# Patient Record
Sex: Female | Born: 1995 | Race: Black or African American | Hispanic: No | Marital: Single | Smoking: Former smoker
Health system: Southern US, Community
[De-identification: ages and names within clinical notes are randomized; demographics above are authoritative.]

## PROBLEM LIST (undated history)

## (undated) DIAGNOSIS — B009 Herpesviral infection, unspecified: Secondary | ICD-10-CM

## (undated) DIAGNOSIS — J45909 Unspecified asthma, uncomplicated: Secondary | ICD-10-CM

## (undated) HISTORY — PX: NO PAST SURGERIES: SHX2092

---
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
## 2013-07-23 ENCOUNTER — Encounter: Payer: Self-pay | Admitting: Family Medicine

## 2013-07-23 ENCOUNTER — Other Ambulatory Visit (HOSPITAL_COMMUNITY)
Admission: RE | Admit: 2013-07-23 | Discharge: 2013-07-23 | Disposition: A | Discharge status: Home / Self Care | Payer: Medicaid Other | Source: Ambulatory Visit | Attending: Family Medicine | Admitting: Family Medicine

## 2013-07-23 ENCOUNTER — Ambulatory Visit (INDEPENDENT_AMBULATORY_CARE_PROVIDER_SITE_OTHER): Payer: Medicaid Other | Admitting: Family Medicine

## 2013-07-23 VITALS — BP 120/78 | HR 105 | Temp 98.1°F | Ht 62.0 in | Wt 116.0 lb

## 2013-07-23 DIAGNOSIS — N898 Other specified noninflammatory disorders of vagina: Secondary | ICD-10-CM

## 2013-07-23 DIAGNOSIS — Z113 Encounter for screening for infections with a predominantly sexual mode of transmission: Secondary | ICD-10-CM | POA: Insufficient documentation

## 2013-07-23 DIAGNOSIS — L2089 Other atopic dermatitis: Secondary | ICD-10-CM

## 2013-07-23 DIAGNOSIS — J45909 Unspecified asthma, uncomplicated: Secondary | ICD-10-CM

## 2013-07-23 DIAGNOSIS — J4520 Mild intermittent asthma, uncomplicated: Secondary | ICD-10-CM

## 2013-07-23 DIAGNOSIS — N760 Acute vaginitis: Secondary | ICD-10-CM

## 2013-07-23 LAB — POCT WET PREP (WET MOUNT): Clue Cells Wet Prep Whiff POC: POSITIVE

## 2013-07-23 MED ORDER — METRONIDAZOLE 500 MG PO TABS: 500.0000 mg | ORAL_TABLET | Freq: Two times a day (BID) | ORAL | Status: DC

## 2013-07-23 MED ORDER — TRIAMCINOLONE ACETONIDE 0.1 % EX OINT: 1.0000 "application " | TOPICAL_OINTMENT | Freq: Two times a day (BID) | CUTANEOUS | Status: DC

## 2013-07-23 NOTE — Progress Notes (Signed)
   Subjective:    Patient ID: Kelly Henry, female    DOB: 02/23/1995, 18 y.o.   MRN: [id number]  HPI  Vaginal discharge - for last 3 days.  Fishy odor, white discharge, no sores or fever or bleeding, No specific concerns about stds.  LMP -  Eczema Flare over last few weeks wityh dry skin on elbow and knee creases. Using body wash an otc HC.  No blisters or fever or mouth ulcers  Patient reports no  vision/ hearing changes,anorexia, weight change, fever ,adenopathy, persistant / recurrent hoarseness, swallowing issues, chest pain, edema,persistant / recurrent cough, hemoptysis, dyspnea(rest, exertional, paroxysmal nocturnal), gastrointestinal  bleeding (melena, rectal bleeding), abdominal pain, excessive heart burn, GU symptoms(dysuria, hematuria, pyuria, voiding/incontinence  Issues) syncope, focal weakness, severe memory loss, concerning skin lesions, depression, anxiety, abnormal bruising/bleeding, major joint swelling, breast masses or abnormal vaginal bleeding.   Except as above  Tobacco - smokes 3 cigs per day    Review of Systems     Objective:   Physical Exam  Alert no acute distress Neck:  No deformities, thyromegaly, masses, or tenderness noted.   Supple with full range of motion without pain. Heart - Regular rate and rhythm.  No murmurs, gallops or rubs. Lungs:  Normal respiratory effort, chest expands symmetrically. Lungs are clear to auscultation, no crackles or wheezes. Abdomen: soft and non-tender without masses, organomegaly or hernias noted.  No guarding or rebound Genitalia:  Normal introitus for age, no external lesions, no vaginal discharge, no odor, mucosa pink and moist, white thick vaginal or cervical lesions, no vaginal atrophy, no friaility or hemorrhage,  Extremities:  No cyanosis, edema, or deformity noted with good range of motion of all major joints.   Skin - dry area over flexure creases in elbows and knees - poorly demarginated       Assessment &  Plan:

## 2013-07-23 NOTE — Patient Instructions (Signed)
Good to see you today!  Thanks for coming in.  For your skin use the ointment twice daily and Dove or basis soap only as needed  For the vaginal discharge  Smoking - the best thing you could do for your health is to stop smoking  Consider finding a substitute  Saving the money you don't spend

## 2013-07-23 NOTE — Assessment & Plan Note (Signed)
Using albuterol once per week only

## 2013-07-23 NOTE — Assessment & Plan Note (Signed)
Flared.  Treat with steroid ointment

## 2013-07-23 NOTE — Assessment & Plan Note (Signed)
Wet mount consistent with BV.  Treat and screen for stds

## 2013-07-24 ENCOUNTER — Telehealth: Payer: Self-pay | Admitting: *Deleted

## 2013-07-24 ENCOUNTER — Encounter: Payer: Self-pay | Admitting: *Deleted

## 2013-07-24 NOTE — Telephone Encounter (Signed)
Message copied by Henri MedalHARTSELL, JAZMIN M on Tue Jul 24, 2013  4:26 PM ------      Message from: Carney LivingHAMBLISS, MARSHALL L      Created: Tue Jul 24, 2013  4:14 PM       Please notify her her cultures are normal      Thanks ------

## 2013-07-24 NOTE — Telephone Encounter (Signed)
LM for patient to call back regarding results.  Letter mailed. Jazmin Hartsell,CMA

## 2013-10-04 ENCOUNTER — Other Ambulatory Visit: Payer: Self-pay | Admitting: Family Medicine

## 2014-04-24 ENCOUNTER — Other Ambulatory Visit (HOSPITAL_COMMUNITY)
Admission: RE | Admit: 2014-04-24 | Discharge: 2014-04-24 | Disposition: A | Discharge status: Home / Self Care | Payer: Medicaid Other | Source: Ambulatory Visit | Attending: Family Medicine | Admitting: Family Medicine

## 2014-04-24 ENCOUNTER — Telehealth: Payer: Self-pay | Admitting: Family Medicine

## 2014-04-24 ENCOUNTER — Ambulatory Visit (INDEPENDENT_AMBULATORY_CARE_PROVIDER_SITE_OTHER): Payer: Self-pay | Admitting: Family Medicine

## 2014-04-24 ENCOUNTER — Encounter: Payer: Self-pay | Admitting: Family Medicine

## 2014-04-24 VITALS — BP 128/78 | HR 77 | Temp 98.3°F | Ht 62.0 in | Wt 115.2 lb

## 2014-04-24 DIAGNOSIS — B9689 Other specified bacterial agents as the cause of diseases classified elsewhere: Secondary | ICD-10-CM | POA: Insufficient documentation

## 2014-04-24 DIAGNOSIS — N766 Ulceration of vulva: Secondary | ICD-10-CM

## 2014-04-24 DIAGNOSIS — Z113 Encounter for screening for infections with a predominantly sexual mode of transmission: Secondary | ICD-10-CM | POA: Insufficient documentation

## 2014-04-24 DIAGNOSIS — N760 Acute vaginitis: Secondary | ICD-10-CM

## 2014-04-24 DIAGNOSIS — A499 Bacterial infection, unspecified: Secondary | ICD-10-CM

## 2014-04-24 DIAGNOSIS — A5901 Trichomonal vulvovaginitis: Secondary | ICD-10-CM

## 2014-04-24 LAB — POCT WET PREP (WET MOUNT): Clue Cells Wet Prep Whiff POC: POSITIVE

## 2014-04-24 MED ORDER — METRONIDAZOLE 500 MG PO TABS: 500.0000 mg | ORAL_TABLET | Freq: Two times a day (BID) | ORAL | Status: DC

## 2014-04-24 NOTE — Telephone Encounter (Signed)
+  BV and trichomonas on wet prep. Will need treatment for herself and her partner.

## 2014-04-24 NOTE — Progress Notes (Signed)
Subjective: Kelly Henry is a 19 y.o. female patient of Dr. Phebe CollaEniola's presenting for vaginal rash.  She reports 7 days of constant, sudden onset painful vaginal rash. Rash involves bumps and sores "like pimples" on the outside of the vagina. No lesions on the inside. No rash anywhere else including mouth, lips, palms, soles. She tried vagisil without improvement. No fevers, abd pain, vaginal discharge, odor, dysuria, back pain, or missed periods. She is sexually active with a female partner and uses condoms regularly.   ROS see HPI Smoking Status noted  Objective: BP 128/78 mmHg  Pulse 77  Temp(Src) 98.3 F (36.8 C) (Oral)  Ht 5\' 2"  (1.575 m)  Wt 115 lb 3.2 oz (52.254 kg)  BMI 21.06 kg/m2  LMP 04/06/2014 (Approximate) Gen: Well appearing 19 y.o. female in no distress Pelvic: Scattered (< 15 total) very tender papules and pustules (~0.5cm diameter) on labia majora bilaterally with some shallow ulcerations. No inguinal lymphadenopathy. Vaginal mucosa pink, moist, normal rugae.  Nonfriable cervix without lesions, no discharge or bleeding noted on speculum exam. No cervical motion tenderness.   April Zimmerman, CMA present throughout duration of exam.   Assessment/Plan: Kelly Henry is a 19 y.o. female here for genital outbreak.  See problem list for plan.

## 2014-04-24 NOTE — Assessment & Plan Note (Signed)
Treat with flagyl 500mg  BID x7 days as above for BV. Will discuss need for partner to be treated as well in lieu of EPT due to possibility of other coinfections.

## 2014-04-24 NOTE — Assessment & Plan Note (Signed)
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 No systemic symptoms, but no history of ulcers in past.  - Viral culture sent - Wet prep/GC/Chl.  - Follow up in 2-3 weeks for resolution recheck. May biopsy if Dx remains unclear.  Precepted with Dr. Leveda AnnaHensel.

## 2014-04-24 NOTE — Progress Notes (Signed)
I was the preceptor for this visit. 

## 2014-04-24 NOTE — Patient Instructions (Signed)
Follow up in 2 weeks to be reevaluated.

## 2014-04-24 NOTE — Telephone Encounter (Signed)
Called pt and informed her of below. Zimmerman Rumple, April D 

## 2014-04-25 LAB — CERVICOVAGINAL ANCILLARY ONLY
CHLAMYDIA, DNA PROBE: NEGATIVE
Neisseria Gonorrhea: NEGATIVE

## 2014-04-26 LAB — HERPES SIMPLEX VIRUS CULTURE: Organism ID, Bacteria: DETECTED

## 2014-05-13 ENCOUNTER — Encounter: Payer: Self-pay | Admitting: Family Medicine

## 2014-05-20 ENCOUNTER — Telehealth: Payer: Self-pay | Admitting: Family Medicine

## 2014-05-20 NOTE — Telephone Encounter (Signed)
Will forward to MD to discuss results with patient.  Jazmin Hartsell,CMA

## 2014-05-20 NOTE — Telephone Encounter (Signed)
Result discussed with patient. +HSV, she is currently asymptomatic. She is advised to schedule follow up with me soon for further management.

## 2014-05-20 NOTE — Telephone Encounter (Signed)
Pt has questions about the letter she received about her test results

## 2014-05-21 ENCOUNTER — Encounter: Payer: Self-pay | Admitting: Family Medicine

## 2014-05-21 ENCOUNTER — Ambulatory Visit (INDEPENDENT_AMBULATORY_CARE_PROVIDER_SITE_OTHER): Payer: Self-pay | Admitting: Family Medicine

## 2014-05-21 VITALS — BP 109/70 | HR 73 | Temp 98.5°F | Ht 62.0 in | Wt 114.2 lb

## 2014-05-21 DIAGNOSIS — L309 Dermatitis, unspecified: Secondary | ICD-10-CM

## 2014-05-21 DIAGNOSIS — Z202 Contact with and (suspected) exposure to infections with a predominantly sexual mode of transmission: Secondary | ICD-10-CM

## 2014-05-21 DIAGNOSIS — R894 Abnormal immunological findings in specimens from other organs, systems and tissues: Secondary | ICD-10-CM

## 2014-05-21 MED ORDER — TRIAMCINOLONE ACETONIDE 0.1 % EX OINT: 1.0000 "application " | TOPICAL_OINTMENT | Freq: Two times a day (BID) | CUTANEOUS | Status: DC

## 2014-05-21 NOTE — Progress Notes (Signed)
Subjective:     Patient ID: Kelly Henry, female   DOB: 09/17/1995, 19 y.o.   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 She denies any vaginal discharge or lesion today. She completed her treatment for BV and Trichomonas as prescribed. Her partner has not had any oral or vulva lesion.  She had been with the same partner for 1.2326yr no STD in both partner and patient. Eczema:Need refill of her topical steroid cream.  Current Outpatient Prescriptions on File Prior to Visit  Medication Sig Dispense Refill  . QVAR 40 MCG/ACT inhaler inhale 1 puff by mouth twice a day for asthma 1 Inhaler 2  . VENTOLIN HFA 108 (90 BASE) MCG/ACT inhaler INHALE 2 PUFFS INTO THE LUNGS EVERY 4 HOURS AS NEEDED, RESCUE INHALER: FOR SHORTNESS OF BREATH, USE IN PLACE OF ALBUTEROL NEBS 36 g 3   No current facility-administered medications on file prior to visit.   History reviewed. No pertinent past medical history.    Review of Systems  Respiratory: Negative.   Cardiovascular: Negative.   Gastrointestinal: Negative.   Genitourinary: Negative.   All other systems reviewed and are negative.      Filed Vitals:   05/21/14 1011  BP: 109/70  Pulse: 73  Temp: 98.5 F (36.9 C)  TempSrc: Oral  Height: 5\' 2"  (1.575 m)  Weight: 114 lb 3 oz (51.795 kg)    Objective:   Physical Exam  Constitutional: She appears well-developed. No distress.  Cardiovascular: Normal rate, regular rhythm and normal heart sounds.   No murmur heard. Pulmonary/Chest: Effort normal and breath sounds normal. No respiratory distress. She has no wheezes. She exhibits no tenderness.  Abdominal: Soft. Bowel sounds are normal. She exhibits no distension and no mass.  Genitourinary:  Deferred,no genital lesion per patient and no discharge.   Nursing note and vitals reviewed.      Assessment:     + HSV 1 via culture Eczema     Plan:     Check problem list.

## 2014-05-21 NOTE — Patient Instructions (Signed)
Genital Herpes °Genital herpes is a sexually transmitted disease. This means that it is a disease passed by having sex with an infected person. There is no cure for genital herpes. The time between attacks can be months to years. The virus may live in a person but produce no problems (symptoms). This infection can be passed to a baby as it travels down the birth canal (vagina). In a newborn, this can cause central nervous system damage, eye damage, or even death. The virus that causes genital herpes is usually HSV-2 virus. The virus that causes oral herpes is usually HSV-1. The diagnosis (learning what is wrong) is made through culture results. °SYMPTOMS  °Usually symptoms of pain and itching begin a few days to a week after contact. It first appears as small blisters that progress to small painful ulcers which then scab over and heal after several days. It affects the outer genitalia, birth canal, cervix, penis, anal area, buttocks, and thighs. °HOME CARE INSTRUCTIONS  °· Keep ulcerated areas dry and clean. °· Take medications as directed. Antiviral medications can speed up healing. They will not prevent recurrences or cure this infection. These medications can also be taken for suppression if there are frequent recurrences. °· While the infection is active, it is contagious. Avoid all sexual contact during active infections. °· Condoms may help prevent spread of the herpes virus. °· Practice safe sex. °· Wash your hands thoroughly after touching the genital area. °· Avoid touching your eyes after touching your genital area. °· Inform your caregiver if you have had genital herpes and become pregnant. It is your responsibility to insure a safe outcome for your baby in this pregnancy. °· Only take over-the-counter or prescription medicines for pain, discomfort, or fever as directed by your caregiver. °SEEK MEDICAL CARE IF:  °· You have a recurrence of this infection. °· You do not respond to medications and are not  improving. °· You have new sources of pain or discharge which have changed from the original infection. °· You have an oral temperature above 102° F (38.9° C). °· You develop abdominal pain. °· You develop eye pain or signs of eye infection. °Document Released: 01/23/2000 Document Revised: 04/19/2011 Document Reviewed: 02/12/2009 °Exit[employer]® Patient Information ©2015 [employer]. This information is not intended to replace advice given to you by your health care provider. Make sure you discuss any questions you have with your health care provider. ° °

## 2014-05-21 NOTE — Assessment & Plan Note (Signed)
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 Patient is asking for a retest, she does not believe she has this. I recommended getting a blood test since she currently does not have any lesion. I spent time discussing the difference between HSV 1 & 2  I also recommended HIV and RPR testing since she will be getting blood test anyway. She agreed with plan.

## 2014-05-21 NOTE — Assessment & Plan Note (Signed)
Currently asymptomatic. I refilled her triamcinolone.

## 2014-05-22 ENCOUNTER — Telehealth: Payer: Self-pay | Admitting: Family Medicine

## 2014-05-22 ENCOUNTER — Encounter: Payer: Self-pay | Admitting: Family Medicine

## 2014-05-22 LAB — RPR

## 2014-05-22 LAB — HSV 2 ANTIBODY, IGG: HSV 2 Glycoprotein G Ab, IgG: 0.23 IV

## 2014-05-22 LAB — HSV 1 ANTIBODY, IGG: HSV 1 Glycoprotein G Ab, IgG: 0.71 IV

## 2014-05-22 LAB — HIV ANTIBODY (ROUTINE TESTING W REFLEX): HIV 1&2 Ab, 4th Generation: NONREACTIVE

## 2014-05-22 NOTE — Telephone Encounter (Signed)
Result discussed with patient. She will hold off on retesting for now.

## 2014-05-22 NOTE — Telephone Encounter (Signed)
Message left for her to call back about her test result. No further information was given.    Result: serum Neg HSV 1 & 2              + HSV 1 wound culture.   It could be that this is a recent infection, it may take up to 12 wks or more for antibody to occur in the blood although her culture was positive. She may return in 3-5 months to recheck if she is still concern.  I will mail result letter to her since she didn't pick up her call.

## 2014-09-14 ENCOUNTER — Encounter (HOSPITAL_COMMUNITY): Payer: Self-pay | Admitting: Emergency Medicine

## 2014-09-14 ENCOUNTER — Emergency Department (HOSPITAL_COMMUNITY): Payer: Self-pay

## 2014-09-14 ENCOUNTER — Emergency Department (HOSPITAL_COMMUNITY)
Admission: EM | Admit: 2014-09-14 | Discharge: 2014-09-14 | Disposition: A | Discharge status: Home / Self Care | Payer: Self-pay | Attending: Emergency Medicine | Admitting: Emergency Medicine

## 2014-09-14 DIAGNOSIS — Z3202 Encounter for pregnancy test, result negative: Secondary | ICD-10-CM | POA: Insufficient documentation

## 2014-09-14 DIAGNOSIS — J45909 Unspecified asthma, uncomplicated: Secondary | ICD-10-CM | POA: Insufficient documentation

## 2014-09-14 DIAGNOSIS — Z7951 Long term (current) use of inhaled steroids: Secondary | ICD-10-CM | POA: Insufficient documentation

## 2014-09-14 DIAGNOSIS — K2970 Gastritis, unspecified, without bleeding: Secondary | ICD-10-CM | POA: Insufficient documentation

## 2014-09-14 HISTORY — DX: Unspecified asthma, uncomplicated: J45.909

## 2014-09-14 LAB — URINALYSIS, ROUTINE W REFLEX MICROSCOPIC
Bilirubin Urine: NEGATIVE
GLUCOSE, UA: NEGATIVE mg/dL
HGB URINE DIPSTICK: NEGATIVE
Ketones, ur: NEGATIVE mg/dL
Leukocytes, UA: NEGATIVE
Nitrite: NEGATIVE
PROTEIN: 30 mg/dL — AB
Specific Gravity, Urine: 1.022 (ref 1.005–1.030)
Urobilinogen, UA: 1 mg/dL (ref 0.0–1.0)
pH: 8.5 — ABNORMAL HIGH (ref 5.0–8.0)

## 2014-09-14 LAB — COMPREHENSIVE METABOLIC PANEL
ALK PHOS: 48 U/L (ref 38–126)
ALT: 25 U/L (ref 14–54)
AST: 33 U/L (ref 15–41)
Albumin: 4.8 g/dL (ref 3.5–5.0)
Anion gap: 10 (ref 5–15)
BUN: 9 mg/dL (ref 6–20)
CALCIUM: 9.5 mg/dL (ref 8.9–10.3)
CO2: 22 mmol/L (ref 22–32)
Chloride: 108 mmol/L (ref 101–111)
Creatinine, Ser: 0.62 mg/dL (ref 0.44–1.00)
GFR calc non Af Amer: >60 mL/min (ref >60)
GLUCOSE: 103 mg/dL — AB (ref 65–99)
POTASSIUM: 3.4 mmol/L — AB (ref 3.5–5.1)
Sodium: 140 mmol/L (ref 135–145)
Total Bilirubin: 0.6 mg/dL (ref 0.3–1.2)
Total Protein: 7.8 g/dL (ref 6.5–8.1)

## 2014-09-14 LAB — CBC WITH DIFFERENTIAL/PLATELET
Basophils Absolute: 0 10*3/uL (ref 0.0–0.1)
Basophils Relative: 0 % (ref 0–1)
EOS ABS: 0 10*3/uL (ref 0.0–0.7)
Eosinophils Relative: 0 % (ref 0–5)
HEMATOCRIT: 39.1 % (ref 36.0–46.0)
Hemoglobin: 13.1 g/dL (ref 12.0–15.0)
LYMPHS ABS: 0.6 10*3/uL — AB (ref 0.7–4.0)
LYMPHS PCT: 16 % (ref 12–46)
MCH: 29.2 pg (ref 26.0–34.0)
MCHC: 33.5 g/dL (ref 30.0–36.0)
MCV: 87.3 fL (ref 78.0–100.0)
MONO ABS: 0.1 10*3/uL (ref 0.1–1.0)
MONOS PCT: 3 % (ref 3–12)
Neutro Abs: 3.1 10*3/uL (ref 1.7–7.7)
Neutrophils Relative %: 81 % — ABNORMAL HIGH (ref 43–77)
PLATELETS: 237 10*3/uL (ref 150–400)
RBC: 4.48 MIL/uL (ref 3.87–5.11)
RDW: 12.9 % (ref 11.5–15.5)
WBC: 3.8 10*3/uL — ABNORMAL LOW (ref 4.0–10.5)

## 2014-09-14 LAB — POC URINE PREG, ED: Preg Test, Ur: NEGATIVE

## 2014-09-14 LAB — URINE MICROSCOPIC-ADD ON

## 2014-09-14 LAB — LIPASE, BLOOD: Lipase: 13 U/L — ABNORMAL LOW (ref 22–51)

## 2014-09-14 MED ORDER — ONDANSETRON HCL 4 MG/2ML IJ SOLN
4.0000 mg | Freq: Once | INTRAMUSCULAR | Status: AC
  Administered 2014-09-14: 4 mg via INTRAVENOUS
  Filled 2014-09-14: qty 2

## 2014-09-14 MED ORDER — SODIUM CHLORIDE 0.9 % IV BOLUS (SEPSIS)
1000.0000 mL | Freq: Once | INTRAVENOUS | Status: AC
  Administered 2014-09-14: 1000 mL via INTRAVENOUS

## 2014-09-14 MED ORDER — FENTANYL CITRATE (PF) 100 MCG/2ML IJ SOLN
100.0000 ug | Freq: Once | INTRAMUSCULAR | Status: AC
  Administered 2014-09-14: 100 ug via INTRAVENOUS
  Filled 2014-09-14: qty 2

## 2014-09-14 MED ORDER — SUCRALFATE 1 G PO TABS: 1.0000 g | ORAL_TABLET | Freq: Four times a day (QID) | ORAL | Status: DC

## 2014-09-14 MED ORDER — PROMETHAZINE HCL 25 MG PO TABS: 25.0000 mg | ORAL_TABLET | Freq: Three times a day (TID) | ORAL | Status: DC | PRN

## 2014-09-14 MED ORDER — FAMOTIDINE 20 MG PO TABS: 20.0000 mg | ORAL_TABLET | Freq: Two times a day (BID) | ORAL | Status: DC

## 2014-09-14 NOTE — ED Provider Notes (Signed)
CSN: [id number]     Arrival date & time 09/14/14  1410 History   First MD Initiated Contact with Patient 09/14/14 1501     Chief Complaint  Patient presents with  . Abdominal Pain    since this AM  . Nausea    since this AM     (Consider location/radiation/quality/duration/timing/severity/associated sxs/prior Treatment) HPI   Patient is a 19 year old female who presents with abdominal pain.  Patient states she awoke this morning with acute onset epigastric pain.  Pain is associated with nausea and vomiting. Patient states she was drinking alcohol last night. She states she has vomited several times today. She tried to take Pepto-bismol and ibuprofen but was unable to keep it down.  She denies headache, heartburn, indigestion, dysuria, constipation, headache, or dizziness.   Past Medical History  Diagnosis Date  . Asthma    History reviewed. No pertinent past surgical history. No family history on file. History  Substance Use Topics  . Smoking status: Passive Smoke Exposure - Never Smoker  . Smokeless tobacco: Never Used     Comment: Mother smokes  . Alcohol Use: No   OB History    No data available     Review of Systems All other systems negative except as documented in the HPI. All pertinent positives and negatives as reviewed in the HPI.   Allergies  Review of patient's allergies indicates no known allergies.  Home Medications   Prior to Admission medications   Medication Sig Start Date End Date Taking? Authorizing Provider  acetaminophen (TYLENOL) 325 MG tablet Take 650 mg by mouth every 6 (six) hours as needed for moderate pain.   Yes Historical Provider, MD  beclomethasone (QVAR) 40 MCG/ACT inhaler Inhale 1 puff into the lungs 2 (two) times daily.   Yes Historical Provider, MD  VENTOLIN HFA 108 (90 BASE) MCG/ACT inhaler INHALE 2 PUFFS INTO THE LUNGS EVERY 4 HOURS AS NEEDED, RESCUE INHALER: FOR SHORTNESS OF BREATH, USE IN PLACE OF ALBUTEROL NEBS 10/04/13  Yes Doreene Eland, MD  QVAR 40 MCG/ACT inhaler inhale 1 puff by mouth twice a day for asthma Patient not taking: Reported on 09/14/2014 01/15/12   Macy Mis, MD  triamcinolone ointment (KENALOG) 0.1 % Apply 1 application topically 2 (two) times daily. Patient not taking: Reported on 09/14/2014 05/21/14   Doreene Eland, MD   BP 124/83 mmHg  Pulse 64  Temp(Src) 98.3 F (36.8 C) (Oral)  Resp 20  SpO2 100%  LMP 09/01/2014 Physical Exam  Constitutional: She is oriented to person, place, and time. She appears well-developed and well-nourished. No distress.  HENT:  Head: Normocephalic and atraumatic.  Mouth/Throat: Oropharynx is clear and moist.  Eyes: Pupils are equal, round, and reactive to light.  Neck: Normal range of motion. Neck supple.  Cardiovascular: Normal rate, regular rhythm and normal heart sounds.  Exam reveals no gallop and no friction rub.   No murmur heard. Pulmonary/Chest: Effort normal and breath sounds normal. No respiratory distress. She has no wheezes.  Abdominal: Soft. Bowel sounds are normal. She exhibits no distension. There is no tenderness. There is no guarding.  Neurological: She is alert and oriented to person, place, and time.  Skin: Skin is warm and dry.  Psychiatric: She has a normal mood and affect. Her behavior is normal.  Nursing note and vitals reviewed.   ED Course  Procedures (including critical care time) Labs Review Labs Reviewed  CBC WITH DIFFERENTIAL/PLATELET - Abnormal; Notable for the  following:    WBC 3.8 (*)    Neutrophils Relative % 81 (*)    Lymphs Abs 0.6 (*)    All other components within normal limits  COMPREHENSIVE METABOLIC PANEL - Abnormal; Notable for the following:    Potassium 3.4 (*)    Glucose, Bld 103 (*)    All other components within normal limits  URINALYSIS, ROUTINE W REFLEX MICROSCOPIC (NOT AT [facility]) - Abnormal; Notable for the following:    pH 8.5 (*)    Protein, ur 30 (*)    All other components within normal limits   URINE MICROSCOPIC-ADD ON - Abnormal; Notable for the following:    Squamous Epithelial / LPF MANY (*)    All other components within normal limits  LIPASE, BLOOD - Abnormal; Notable for the following:    Lipase 13 (*)    All other components within normal limits  POC URINE PREG, ED   The patient refused ultrasound.  I have advised her that this was likely related to her alcohol consumption and she has gastritis.  Told to return here as needed.  Told to follow up with her primary care Dr. told to slowly increase her fluid intake and rest as much as possible     Charlestine Night, PA-C 09/14/14 1657  Samuel Jester, DO 09/18/14 1646

## 2014-09-14 NOTE — ED Notes (Signed)
Pt A+ox4, reports waking up this morning with "my abdomen being real tight".  Pt also reports +nausea.  Pt denies other complaints.  Skin PWD.  MAEI, self repositioning for comfort, ambulatory with steady gait.  Speaking full/clear sentences, rr even/un-lab.  Pt is well appearing.  NAD.

## 2014-09-14 NOTE — Discharge Instructions (Signed)
Return here as needed.  Slowly increase your fluid intake, rest as much as possible.  Follow-up with your primary care doctor °

## 2014-12-04 ENCOUNTER — Emergency Department (HOSPITAL_COMMUNITY)
Admission: EM | Admit: 2014-12-04 | Discharge: 2014-12-04 | Disposition: A | Discharge status: Home / Self Care | Payer: Medicaid Other | Attending: Emergency Medicine | Admitting: Emergency Medicine

## 2014-12-04 ENCOUNTER — Encounter (HOSPITAL_COMMUNITY): Payer: Self-pay | Admitting: Emergency Medicine

## 2014-12-04 DIAGNOSIS — Z79899 Other long term (current) drug therapy: Secondary | ICD-10-CM | POA: Insufficient documentation

## 2014-12-04 DIAGNOSIS — K029 Dental caries, unspecified: Secondary | ICD-10-CM | POA: Insufficient documentation

## 2014-12-04 DIAGNOSIS — Z7951 Long term (current) use of inhaled steroids: Secondary | ICD-10-CM | POA: Insufficient documentation

## 2014-12-04 DIAGNOSIS — K0889 Other specified disorders of teeth and supporting structures: Secondary | ICD-10-CM

## 2014-12-04 DIAGNOSIS — J45909 Unspecified asthma, uncomplicated: Secondary | ICD-10-CM | POA: Insufficient documentation

## 2014-12-04 DIAGNOSIS — K002 Abnormalities of size and form of teeth: Secondary | ICD-10-CM | POA: Insufficient documentation

## 2014-12-04 DIAGNOSIS — M542 Cervicalgia: Secondary | ICD-10-CM

## 2014-12-04 MED ORDER — MELOXICAM 15 MG PO TABS: 15.0000 mg | ORAL_TABLET | Freq: Every day | ORAL | Status: DC

## 2014-12-04 MED ORDER — NAPROXEN 500 MG PO TABS
500.0000 mg | ORAL_TABLET | Freq: Once | ORAL | Status: AC
Start: 2014-12-04 — End: 2014-12-04
  Administered 2014-12-04: 500 mg via ORAL
  Filled 2014-12-04: qty 1

## 2014-12-04 MED ORDER — CYCLOBENZAPRINE HCL 10 MG PO TABS
10.0000 mg | ORAL_TABLET | Freq: Once | ORAL | Status: AC
  Administered 2014-12-04: 10 mg via ORAL
  Filled 2014-12-04: qty 1

## 2014-12-04 MED ORDER — CYCLOBENZAPRINE HCL 10 MG PO TABS: 10.0000 mg | ORAL_TABLET | Freq: Two times a day (BID) | ORAL | Status: DC | PRN

## 2014-12-04 NOTE — ED Provider Notes (Signed)
CSN: [id number]     Arrival date & time 12/04/14  2057 History   By signing my name below, I, Kelly Henry, attest that this documentation has been prepared under the direction and in the presence of Emilia Beck, PA-C Electronically Signed: Gonzella Henry, Scribe. 12/04/2014. 10:49 PM.    Chief Complaint  Patient presents with  . Dental Problem  . Neck Pain      The history is provided by the patient. No language interpreter was used.    HPI Comments: CAMESHIA CRESSMAN is a 19 y.o. female who presents to the Emergency Department complaining of neck pain. Pt's symptoms started out as dental pain 2 months ago with radiation to right side of neck Pt reports onset of left sided neck pain two days ago. Pt reports pain with neck movement but denies pain to touch. She reports taking Ibuprofen and Tylenol extra strength with mild relief but reports no relief with massaging. Pt has been sleeping with a heating pad which has provided moderate relief. Pt has no other complaints at this time. Pt notes she has dental appointment tomorrow to have tooth extracted.  Past Medical History  Diagnosis Date  . Asthma    History reviewed. No pertinent past surgical history. History reviewed. No pertinent family history. Social History  Substance Use Topics  . Smoking status: Passive Smoke Exposure - Never Smoker  . Smokeless tobacco: Never Used     Comment: Mother smokes  . Alcohol Use: No   OB History    No data available     Review of Systems  Constitutional: Negative for chills and fatigue.  HENT: Positive for dental problem.   Musculoskeletal: Positive for neck pain.  All other systems reviewed and are negative.     Allergies  Review of patient's allergies indicates no known allergies.  Home Medications   Prior to Admission medications   Medication Sig Start Date End Date Taking? Authorizing Provider  acetaminophen (TYLENOL) 325 MG tablet Take 650 mg by mouth every  6 (six) hours as needed for moderate pain.    Historical Provider, MD  beclomethasone (QVAR) 40 MCG/ACT inhaler Inhale 1 puff into the lungs 2 (two) times daily.    Historical Provider, MD  cyclobenzaprine (FLEXERIL) 10 MG tablet Take 1 tablet (10 mg total) by mouth 2 (two) times daily as needed for muscle spasms. 12/04/14   Yoandri Congrove, PA-C  famotidine (PEPCID) 20 MG tablet Take 1 tablet (20 mg total) by mouth 2 (two) times daily. 09/14/14   Charlestine Night, PA-C  meloxicam (MOBIC) 15 MG tablet Take 1 tablet (15 mg total) by mouth daily. 12/04/14   Gennifer Potenza, PA-C  promethazine (PHENERGAN) 25 MG tablet Take 1 tablet (25 mg total) by mouth every 8 (eight) hours as needed for nausea or vomiting. 09/14/14   Charlestine Night, PA-C  QVAR 40 MCG/ACT inhaler inhale 1 puff by mouth twice a day for asthma Patient not taking: Reported on 09/14/2014 01/15/12   Macy Mis, MD  sucralfate (CARAFATE) 1 G tablet Take 1 tablet (1 g total) by mouth 4 (four) times daily. 09/14/14   Charlestine Night, PA-C  triamcinolone ointment (KENALOG) 0.1 % Apply 1 application topically 2 (two) times daily. Patient not taking: Reported on 09/14/2014 05/21/14   Doreene Eland, MD  VENTOLIN HFA 108 (90 BASE) MCG/ACT inhaler INHALE 2 PUFFS INTO THE LUNGS EVERY 4 HOURS AS NEEDED, RESCUE INHALER: FOR SHORTNESS OF BREATH, USE IN PLACE OF ALBUTEROL NEBS 10/04/13  Doreene ElandKehinde T Eniola, MD   BP 122/60 mmHg  Pulse 84  Temp(Src) 97.6 F (36.4 C) (Oral)  Resp 16  SpO2 100% Physical Exam  Constitutional: She is oriented to person, place, and time. She appears well-developed and well-nourished. No distress.  HENT:  Head: Normocephalic and atraumatic.  Poor dentition Large dental carry of the posterior right lower molar with tenderness to percussion Multiple other metal fillings in molars   Eyes: Conjunctivae are normal.  Neck: Normal range of motion. Neck supple.  Right lateral cervical tenderness to palpation    Cardiovascular: Normal rate and regular rhythm.  Exam reveals no gallop and no friction rub.   No murmur heard. Pulmonary/Chest: Effort normal and breath sounds normal. She has no wheezes. She has no rales. She exhibits no tenderness.  Abdominal: Soft. She exhibits no distension. There is no tenderness.  Musculoskeletal: Normal range of motion.  Neurological: She is alert and oriented to person, place, and time.  Speech is goal-oriented. Moves limbs without ataxia.   Skin: Skin is warm and dry.  Psychiatric: She has a normal mood and affect. Her behavior is normal.  Nursing note and vitals reviewed.   ED Course  Procedures  DIAGNOSTIC STUDIES:    Oxygen Saturation is 100% on room air, normal by my interpretation.   COORDINATION OF CARE:  10:32 PM Will discharge pt with pain medication and muscle relaxer. Suggests patient continue putting heat on neck and suggests pt follow up with dentist. Discussed treatment plan with pt at bedside and pt agreed to plan.     Labs Review Labs Reviewed - No data to display  Imaging Review    EKG Interpretation None      MDM   Final diagnoses:  Pain, dental  Bilateral neck pain    No signs of ludwigs angina. Patient will have flexeril and mobic for neck pain. Vitals stable and patient afebrile.   I personally performed the services described in this documentation, which was scribed in my presence. The recorded information has been reviewed and is accurate.      Emilia BeckKaitlyn Dontray Haberland, PA-C 12/05/14 [phone]  Lavera Guiseana Duo Liu, MD 12/05/14 1032

## 2014-12-04 NOTE — ED Notes (Signed)
Pt states has had a tooth ache from a cavity that has been on-going x 2 months. States pain began to spread into her jaw and right side of over the last month. Over the last couple days the pain has spread into the right side of her neck, and states she can't sleep d/t the pain. No obvious swelling, no redness/exudate in posterior throat, no difficulty breathing.

## 2014-12-04 NOTE — Discharge Instructions (Signed)
Take mobic as needed for pain. Take flexeril as needed for muscle spasm.

## 2015-05-31 ENCOUNTER — Emergency Department (HOSPITAL_COMMUNITY)
Admission: EM | Admit: 2015-05-31 | Discharge: 2015-05-31 | Disposition: A | Discharge status: Home / Self Care | Payer: Medicaid Other | Attending: Emergency Medicine | Admitting: Emergency Medicine

## 2015-05-31 ENCOUNTER — Encounter (HOSPITAL_COMMUNITY): Payer: Self-pay | Admitting: Emergency Medicine

## 2015-05-31 DIAGNOSIS — Z79899 Other long term (current) drug therapy: Secondary | ICD-10-CM | POA: Insufficient documentation

## 2015-05-31 DIAGNOSIS — J45909 Unspecified asthma, uncomplicated: Secondary | ICD-10-CM | POA: Insufficient documentation

## 2015-05-31 DIAGNOSIS — Z87891 Personal history of nicotine dependence: Secondary | ICD-10-CM | POA: Insufficient documentation

## 2015-05-31 DIAGNOSIS — K029 Dental caries, unspecified: Secondary | ICD-10-CM | POA: Insufficient documentation

## 2015-05-31 DIAGNOSIS — K0889 Other specified disorders of teeth and supporting structures: Secondary | ICD-10-CM | POA: Insufficient documentation

## 2015-05-31 DIAGNOSIS — M542 Cervicalgia: Secondary | ICD-10-CM | POA: Insufficient documentation

## 2015-05-31 MED ORDER — BUPIVACAINE-EPINEPHRINE (PF) 0.5% -1:200000 IJ SOLN
1.8000 mL | Freq: Once | INTRAMUSCULAR | Status: AC
  Administered 2015-05-31: 1.8 mL
  Filled 2015-05-31 (×2): qty 1.8

## 2015-05-31 MED ORDER — NAPROXEN 500 MG PO TABS: 500.0000 mg | ORAL_TABLET | Freq: Two times a day (BID) | ORAL | Status: DC

## 2015-05-31 MED ORDER — PENICILLIN V POTASSIUM 500 MG PO TABS: 500.0000 mg | ORAL_TABLET | Freq: Four times a day (QID) | ORAL | Status: AC

## 2015-05-31 NOTE — ED Provider Notes (Signed)
CSN: [id number]     Arrival date & time 05/31/15  0555 History   First MD Initiated Contact with Patient 05/31/15 0557     Chief Complaint  Patient presents with  . Dental Pain   Patient is a 20 y.o. female presenting with tooth pain.  Dental Pain Location:  Lower Lower teeth location:  32/RL 3rd molar Quality:  Throbbing and aching Severity:  Severe Onset quality:  Gradual Duration:  1 year Timing:  Intermittent Progression:  Worsening Chronicity:  Recurrent Context: dental caries   Previous work-up:  Dental exam Relieved by:  Nothing Worsened by:  Cold food/drink Ineffective treatments:  None tried Associated symptoms: neck pain   Associated symptoms: no difficulty swallowing, no drooling, no facial pain, no facial swelling, no fever, no neck swelling and no trismus   Risk factors: lack of dental care    Kelly Henry is a 20 year old female with PMHx of asthma presenting with dental pain. Onset of pain was 1 year ago with acute worsening in the last 24 hours. Denies specific exacerbating factor. States the dental pain has been intermittent over the past year but has been the most severe it has ever been this morning. Pain is described as aching and throbbing and worsened by cold air and food. She has not tried any medications PTA. She states that it radiates down the right side of her neck into her shoulder. Denies neck swelling or inability to rotate her head. She has been seen at a dental clinic outside of [facility] for this and told to follow up with an oral surgeon but she could not afford this. Denies fever, chills, dizziness, syncope, difficulty breathing, difficulty swallowing, nausea or vomiting.   Past Medical History  Diagnosis Date  . Asthma    History reviewed. No pertinent past surgical history. History reviewed. No pertinent family history. Social History  Substance Use Topics  . Smoking status: Former Smoker    Quit date: 03/01/2014  . Smokeless tobacco: Never Used      Comment: Mother smokes  . Alcohol Use: 1.8 oz/week    3 Standard drinks or equivalent per week   OB History    Gravida Para Term Preterm AB TAB SAB Ectopic Multiple Living       Review of Systems  Constitutional: Negative for fever.  HENT: Positive for dental problem. Negative for drooling and facial swelling.   Musculoskeletal: Positive for neck pain.  All other systems reviewed and are negative.     Allergies  Review of patient's allergies indicates no known allergies.  Home Medications   Prior to Admission medications   Medication Sig Start Date End Date Taking? Authorizing Provider  cyclobenzaprine (FLEXERIL) 10 MG tablet Take 1 tablet (10 mg total) by mouth 2 (two) times daily as needed for muscle spasms. Patient not taking: Reported on 05/31/2015 12/04/14   Emilia Beck, PA-C  famotidine (PEPCID) 20 MG tablet Take 1 tablet (20 mg total) by mouth 2 (two) times daily. Patient not taking: Reported on 05/31/2015 09/14/14   Charlestine Night, PA-C  meloxicam (MOBIC) 15 MG tablet Take 1 tablet (15 mg total) by mouth daily. Patient not taking: Reported on 05/31/2015 12/04/14   Emilia Beck, PA-C  naproxen (NAPROSYN) 500 MG tablet Take 1 tablet (500 mg total) by mouth 2 (two) times daily. 05/31/15   Taraoluwa Thakur, PA-C  penicillin v potassium (VEETID) 500 MG tablet Take 1 tablet (500 mg total)  by mouth 4 (four) times daily. 05/31/15 06/07/15  Rolm GalaStevi Keishia Ground, PA-C  promethazine (PHENERGAN) 25 MG tablet Take 1 tablet (25 mg total) by mouth every 8 (eight) hours as needed for nausea or vomiting. Patient not taking: Reported on 05/31/2015 09/14/14   Charlestine Nighthristopher Lawyer, PA-C  QVAR 40 MCG/ACT inhaler inhale 1 puff by mouth twice a day for asthma Patient not taking: Reported on 09/14/2014 01/15/12   Macy MisKim K Briscoe, MD  sucralfate (CARAFATE) 1 G tablet Take 1 tablet (1 g total) by mouth 4 (four) times daily. Patient not taking: Reported on 05/31/2015 09/14/14    Charlestine Nighthristopher Lawyer, PA-C  triamcinolone ointment (KENALOG) 0.1 % Apply 1 application topically 2 (two) times daily. Patient not taking: Reported on 09/14/2014 05/21/14   Doreene ElandKehinde T Eniola, MD  VENTOLIN HFA 108 (90 BASE) MCG/ACT inhaler INHALE 2 PUFFS INTO THE LUNGS EVERY 4 HOURS AS NEEDED, RESCUE INHALER: FOR SHORTNESS OF BREATH, USE IN PLACE OF ALBUTEROL NEBS Patient not taking: Reported on 05/31/2015 10/04/13   Doreene ElandKehinde T Eniola, MD   BP 120/72 mmHg  Pulse 95  Temp(Src) 98.4 F (36.9 C) (Oral)  Resp 18  Ht 5\' 2"  (1.575 m)  Wt 54.432 kg  BMI 21.94 kg/m2  SpO2 100%  LMP 05/07/2015 (Approximate) Physical Exam  Constitutional: She appears well-developed and well-nourished. No distress.  HENT:  Head: Normocephalic and atraumatic.  Right Ear: External ear normal.  Left Ear: External ear normal.  Mouth/Throat: Uvula is midline and oropharynx is clear and moist. Mucous membranes are not dry. No trismus in the jaw. No dental abscesses or uvula swelling.  Severe decay of the right lower 3rd molar. No erythema or drainage from the surrounding gumline. No obvious dental abscess. No trismus. No erythema or soft tissue swelling of the cheek.   Eyes: Conjunctivae are normal. Right eye exhibits no discharge. Left eye exhibits no discharge. No scleral icterus.  Neck: Normal range of motion. Neck supple.  FROM of neck intact without pain. No TTP. No cervical adenopathy. No soft tissue swelling of the neck.   Cardiovascular: Normal rate.   Pulmonary/Chest: Effort normal. No stridor. No respiratory distress.  Musculoskeletal: Normal range of motion.  Lymphadenopathy:    She has no cervical adenopathy.  Neurological: She is alert. Coordination normal.  Skin: Skin is warm and dry.  Psychiatric: She has a normal mood and affect. Her behavior is normal.  Nursing note and vitals reviewed.   ED Course  Procedures (including critical care time) Labs Review Labs Reviewed - No data to display  Imaging  Review No results found. I have personally reviewed and evaluated these images and lab results as part of my medical decision-making.   EKG Interpretation None      MDM   Final diagnoses:  Dentalgia   Patient presenting with tooth pain. No obvious abscess on exam. No soft tissue swelling of the cheek or neck. Exam unconcerning for Ludwig's angina or spread of infection.  Pain controlled in ED with dental block. Will discharge with penicillin and encouraged pt to follow-up with dentist. Resource guide given in discharge paperwork. Return precautions discussed with pt and given in discharge paperwork. Stable for discharge.      Rolm GalaStevi Elia Nunley, PA-C 05/31/15 [id number]  Tomasita CrumbleAdeleke Oni, MD 05/31/15 1455

## 2015-05-31 NOTE — Discharge Instructions (Signed)
Community Resource Guide Dental °The [employer]’s “211” is a great source of information about community services available.  Access by dialing 2-1-1 from anywhere in [place], or by website -  www.nc211.org.  ° °Other Local Resources (Updated 02/2015) ° °Dental  Care °  °Services ° °  °Phone Number and Address  °Cost  °[facility] For children 0 - 21 years of age:  °• Cleaning °• Tooth brushing/flossing instruction °• Sealants, fillings, crowns °• Extractions °• Emergency treatment  [phone] °[address] °[place], [place] [address] Charges based on family income.  Medicaid and some insurance plans accepted.   °  °[facility] • Cleaning °• Sealants, fillings, crowns °• Extractions °• Emergency treatment [phone] °[address] °[place], [place] ° Pregnant women 18 years of age or older with a Medicaid card  °[facility] [place] • Cleaning °• Sealants, fillings, crowns °• Extractions °• Emergency treatment [phone] °[address] °[place], [place] Pregnant women 18 years of age or older with a Medicaid card  °[facility] For children 0 - 21 years of age:  °• Cleaning °• Tooth brushing/flossing instruction °• Sealants, fillings, crowns °• Extractions °• Emergency treatment °Limited orthodontic services for patients with Medicaid [phone] °[address] °[place], [place] [address] Medicaid and [place] Health Choice cover for children up to age 21 and pregnant women.  Parents of children up to age 21 without Medicaid pay a reduced fee at time of service.  °[facility] [place] For children 0 - 21 years of age:  °• Cleaning °• Tooth brushing/flossing instruction °• Sealants, fillings, crowns °• Extractions °• Emergency treatment °Limited orthodontic services for patients with Medicaid  [phone] °[address] °[place], [place].  Medicaid and [place] Health Choice cover for children up to age 21 and pregnant women.  Parents of children up to age 21 without Medicaid pay a reduced fee.  °[facility] • Cleaning °• Sealants, fillings, crowns °• Extractions ° °Hours: Tuesdays and Thursdays, 4:15 - 8 pm [phone] °[address] °[place], [place] [address] Services free of charge to [place] residents ages 18-64 who do not have health insurance, Medicare, Medicaid, or VA benefits and fall within federal poverty guidelines  °[facility] ° ° ° Provides dental care in addition to primary medical care, nutritional counseling, and pharmacy: °• Cleaning °• Sealants, fillings, crowns °• Extractions ° ° ° ° ° ° ° ° ° ° ° ° ° ° ° ° ° [phone] °[facility], [address] °[place], [place] ° °[phone] °[facility], [address] [place], [place] ° °[phone] °[facility] °[place], [place] ° °[phone] °[facility], [address] °[place], [place] ° °[phone] °[facility] °[address] °[place], [place] Accepts Medicaid, Medicare, most insurance.  Also provides services available to all with fees adjusted based on ability to pay.    °[facility] • Cleaning °• Tooth brushing/flossing instruction °• Sealants, fillings, crowns °• Extractions °• Emergency treatment °Hours: Tuesdays, Thursdays, and Fridays from 8 am to 5 pm by appointment only. [phone] °[address] °[place], [place] [address] [place] residents with Medicaid (depending on eligibility) and children with [place] Health Choice - call for more information.  °  [facility] • Extractions only ° °Hours: 2nd and 4th Thursday of each month from 6:30 am - 9 am.   [phone] ext. [phone] °[address] °[place], [place] [address] Ages 18 and older only.  Patients are seen on a first come, first served basis.  °[facility] • Cleanings °• Fillings °• Extractions °• Orthodontics °• Endodontics °• Implants/Crowns/Bridges °• Complete and partial dentures [phone] °[place], [place] Patients must complete an application for services.  There is often a waiting list.   °Dental Pain °Dental pain may be caused by many things, including: °· Tooth decay (cavities or caries). Cavities expose the nerve of your tooth to air and hot or cold temperatures. This can cause pain or discomfort. °· Abscess or infection. A dental abscess is a collection of infected pus from a bacterial infection in the inner part of the tooth (pulp). It usually occurs at the end of the tooth's root. °· Injury. °· An unknown reason (idiopathic). °Your pain may be mild or severe. It may only occur when: °· You are chewing. °· You are exposed to hot or cold temperature. °· You are eating or drinking sugary foods or beverages, such as soda or candy. °Your pain may also be constant. °HOME CARE INSTRUCTIONS °Watch your dental pain for any changes. The following actions may help to lessen any discomfort that you are feeling: °· Take medicines only as directed by your dentist. °· If you were prescribed an antibiotic medicine, finish all of it even if you start to feel better. °· Keep all follow-up visits as directed by your dentist. This is important. °· Do not apply heat to the outside of your face. °· Rinse your mouth or gargle with salt water if directed by your dentist. This helps with pain and swelling. °¨ You can make salt water by adding ¼ tsp of salt to 1 cup of warm water. °· Apply ice to the painful area of your face: °¨ Put ice in a plastic bag. °¨ Place a towel between your skin and the bag. °¨ Leave the ice on for 20 minutes, 2-3 times per day. °· Avoid foods or drinks that cause you pain, such as: °¨ Very hot or very cold foods or  drinks. °¨ Sweet or sugary foods or drinks. °SEEK MEDICAL CARE IF: °· Your pain is not controlled with medicines. °· Your symptoms are worse. °· You have new symptoms. °SEEK IMMEDIATE MEDICAL CARE IF: °· You are unable to open your mouth. °· You are having trouble breathing or swallowing. °· You have a fever. °· Your face, neck, or jaw is swollen. °  °This information is not intended to replace advice given to you by your health care provider. Make sure you discuss any questions you have with your health care provider. °  °Document Released: 01/25/2005 Document Revised: 06/11/2014 Document Reviewed: 01/21/2014 °Elsevier Interactive Patient Education ©2016 [employer] Inc. ° °

## 2015-05-31 NOTE — ED Notes (Signed)
Pt verbalized understanding of discharge instructions, prescriptions and follow up care.  

## 2015-05-31 NOTE — ED Notes (Signed)
Pt reports right lower dental pain x 1 year, worsening through the night last night. Reports seeing dentist who "told me it was a wisdom tooth that needed to come out". Pt was told to see oral surgeon, but reports not being "able to afford it". Reports pain radiates to right side of neck. Denies fever. Afebrile at this time. Denies n/v.

## 2016-11-11 ENCOUNTER — Emergency Department (HOSPITAL_COMMUNITY)
Admission: EM | Admit: 2016-11-11 | Discharge: 2016-11-11 | Disposition: A | Discharge status: Home / Self Care | Payer: Self-pay | Attending: Emergency Medicine | Admitting: Emergency Medicine

## 2016-11-11 ENCOUNTER — Encounter (HOSPITAL_COMMUNITY): Payer: Self-pay | Admitting: Emergency Medicine

## 2016-11-11 DIAGNOSIS — R102 Pelvic and perineal pain: Secondary | ICD-10-CM

## 2016-11-11 DIAGNOSIS — R197 Diarrhea, unspecified: Secondary | ICD-10-CM

## 2016-11-11 DIAGNOSIS — J45909 Unspecified asthma, uncomplicated: Secondary | ICD-10-CM | POA: Insufficient documentation

## 2016-11-11 DIAGNOSIS — R112 Nausea with vomiting, unspecified: Secondary | ICD-10-CM

## 2016-11-11 DIAGNOSIS — Z79899 Other long term (current) drug therapy: Secondary | ICD-10-CM | POA: Insufficient documentation

## 2016-11-11 DIAGNOSIS — A084 Viral intestinal infection, unspecified: Secondary | ICD-10-CM | POA: Insufficient documentation

## 2016-11-11 DIAGNOSIS — Z87891 Personal history of nicotine dependence: Secondary | ICD-10-CM | POA: Insufficient documentation

## 2016-11-11 LAB — CBC
HCT: 38.5 % (ref 36.0–46.0)
Hemoglobin: 13.1 g/dL (ref 12.0–15.0)
MCH: 29.8 pg (ref 26.0–34.0)
MCHC: 34 g/dL (ref 30.0–36.0)
MCV: 87.5 fL (ref 78.0–100.0)
Platelets: 274 10*3/uL (ref 150–400)
RBC: 4.4 MIL/uL (ref 3.87–5.11)
RDW: 12.3 % (ref 11.5–15.5)
WBC: 4.6 10*3/uL (ref 4.0–10.5)

## 2016-11-11 LAB — COMPREHENSIVE METABOLIC PANEL
ALT: 14 U/L (ref 14–54)
AST: 26 U/L (ref 15–41)
Albumin: 4.5 g/dL (ref 3.5–5.0)
Alkaline Phosphatase: 48 U/L (ref 38–126)
Anion gap: 10 (ref 5–15)
BUN: 6 mg/dL (ref 6–20)
CALCIUM: 9.3 mg/dL (ref 8.9–10.3)
CHLORIDE: 107 mmol/L (ref 101–111)
CO2: 23 mmol/L (ref 22–32)
CREATININE: 0.6 mg/dL (ref 0.44–1.00)
Glucose, Bld: 103 mg/dL — ABNORMAL HIGH (ref 65–99)
Potassium: 3.5 mmol/L (ref 3.5–5.1)
Sodium: 140 mmol/L (ref 135–145)
Total Bilirubin: 0.8 mg/dL (ref 0.3–1.2)
Total Protein: 7.2 g/dL (ref 6.5–8.1)

## 2016-11-11 LAB — URINALYSIS, ROUTINE W REFLEX MICROSCOPIC
BILIRUBIN URINE: NEGATIVE
Bacteria, UA: NONE SEEN
GLUCOSE, UA: NEGATIVE mg/dL
Ketones, ur: NEGATIVE mg/dL
LEUKOCYTES UA: NEGATIVE
Nitrite: NEGATIVE
PH: 9 — AB (ref 5.0–8.0)
Protein, ur: >=300 mg/dL — AB
SPECIFIC GRAVITY, URINE: 1.018 (ref 1.005–1.030)

## 2016-11-11 LAB — LIPASE, BLOOD: Lipase: 22 U/L (ref 11–51)

## 2016-11-11 LAB — PREGNANCY, URINE: Preg Test, Ur: NEGATIVE

## 2016-11-11 MED ORDER — ONDANSETRON 4 MG PO TBDP
ORAL_TABLET | ORAL | Status: AC
  Filled 2016-11-11: qty 1

## 2016-11-11 MED ORDER — SODIUM CHLORIDE 0.9 % IV BOLUS (SEPSIS)
1000.0000 mL | Freq: Once | INTRAVENOUS | Status: AC
  Administered 2016-11-11: 1000 mL via INTRAVENOUS

## 2016-11-11 MED ORDER — PROMETHAZINE HCL 25 MG/ML IJ SOLN
12.5000 mg | Freq: Once | INTRAMUSCULAR | Status: AC
  Administered 2016-11-11: 12.5 mg via INTRAVENOUS
  Filled 2016-11-11: qty 1

## 2016-11-11 MED ORDER — ONDANSETRON 4 MG PO TBDP
4.0000 mg | ORAL_TABLET | Freq: Once | ORAL | Status: AC | PRN
  Administered 2016-11-11: 4 mg via ORAL

## 2016-11-11 MED ORDER — PROMETHAZINE HCL 25 MG PO TABS: 25.0000 mg | ORAL_TABLET | Freq: Four times a day (QID) | ORAL | 0 refills | Status: DC | PRN

## 2016-11-11 MED ORDER — ONDANSETRON 4 MG PO TBDP: 4.0000 mg | ORAL_TABLET | Freq: Three times a day (TID) | ORAL | 0 refills | Status: DC | PRN

## 2016-11-11 NOTE — ED Provider Notes (Signed)
[facility] Provider Note   CSN: [id number] Arrival date & time: 11/11/16  1236     History   Chief Complaint Chief Complaint  Patient presents with  . Nausea  . Emesis  . Diarrhea    HPI Kelly Henry is a 21 y.o. female with a PMHx of asthma, who presents to the ED with complaints of nausea, vomiting, and diarrhea that began at 6:30 AM after she arrived at work. She states that she had a total of 7 episodes of nonbloody nonbilious emesis, and about 3-4 episodes of nonbloody watery diarrhea. She was given Zofran with partial relief of her symptoms. She has not tried anything at home. Her symptoms seem to worsen with certain smells. She also reports 5/10 intermittent crampy nonradiating suprapubic abdominal pain with no known aggravating or alleviating factors. She states that she ate at a taco truck yesterday but this is not unusual, stating she eats there regularly, and states the food tasted fine. LMP 11/06/16, still menstruating.   She denies fevers, chills, CP, SOB, constipation, obstipation, melena, hematochezia, hematemesis, hematuria, dysuria, vaginal bleeding/discharge, myalgias, arthralgias, numbness, tingling, focal weakness, or any other complaints at this time. Denies recent travel, sick contacts, definite suspicious food intake, EtOH use, NSAID use, recent abx, or prior abd surgeries.    The history is provided by the patient and medical records. No language interpreter was used.  Emesis   Associated symptoms include abdominal pain and diarrhea. Pertinent negatives include no arthralgias, no chills, no fever and no myalgias.  Diarrhea   Associated symptoms include abdominal pain and vomiting. Pertinent negatives include no chills, no arthralgias and no myalgias.  Abdominal Pain   This is a new problem. The current episode started 6 to 12 hours ago. Episode frequency: intermittent. The problem has not changed since onset.The pain is associated with an unknown  factor. The pain is located in the suprapubic region. The quality of the pain is cramping. The pain is at a severity of 5/10. The pain is mild. Associated symptoms include diarrhea, nausea and vomiting. Pertinent negatives include fever, flatus, hematochezia, melena, constipation, dysuria, hematuria, arthralgias and myalgias. Nothing aggravates the symptoms. Nothing relieves the symptoms.    Past Medical History:  Diagnosis Date  . Asthma     Patient Active Problem List   Diagnosis Date Noted  . 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 Genital labial ulcer 04/24/2014  . BV (bacterial vaginosis) 04/24/2014  . Trichomonas vaginitis 04/24/2014  . Eczema 09/18/2008  . Asthma, intermittent 02/06/2008    History reviewed. No pertinent surgical history.  OB History    Gravida Para Term Preterm AB Living   0 0 0 0 0 0   SAB TAB Ectopic Multiple Live Births   0 0 0 0         Home Medications    Prior to Admission medications   Medication Sig Start Date End Date Taking? Authorizing Provider  cyclobenzaprine (FLEXERIL) 10 MG tablet Take 1 tablet (10 mg total) by mouth 2 (two) times daily as needed for muscle spasms. Patient not taking: Reported on 05/31/2015 12/04/14   Emilia Beck, PA-C  famotidine (PEPCID) 20 MG tablet Take 1 tablet (20 mg total) by mouth 2 (two) times daily. Patient not taking: Reported on 05/31/2015 09/14/14   Charlestine Night, PA-C  meloxicam (MOBIC) 15 MG tablet Take 1 tablet (15 mg total) by mouth daily. Patient not taking: Reported on 05/31/2015 12/04/14   Emilia Beck, PA-C  naproxen (NAPROSYN) 500  MG tablet Take 1 tablet (500 mg total) by mouth 2 (two) times daily. 05/31/15   Barrett, Rolm Gala, PA-C  promethazine (PHENERGAN) 25 MG tablet Take 1 tablet (25 mg total) by mouth every 8 (eight) hours as needed for nausea or vomiting. Patient not taking: Reported on 05/31/2015 09/14/14   Charlestine Night, PA-C  QVAR 40 MCG/ACT inhaler inhale 1 puff by  mouth twice a day for asthma Patient not taking: Reported on 09/14/2014 01/15/12   Macy Mis, MD  sucralfate (CARAFATE) 1 G tablet Take 1 tablet (1 g total) by mouth 4 (four) times daily. Patient not taking: Reported on 05/31/2015 09/14/14   Charlestine Night, PA-C  triamcinolone ointment (KENALOG) 0.1 % Apply 1 application topically 2 (two) times daily. Patient not taking: Reported on 09/14/2014 05/21/14   Doreene Eland, MD  VENTOLIN HFA 108 (90 BASE) MCG/ACT inhaler INHALE 2 PUFFS INTO THE LUNGS EVERY 4 HOURS AS NEEDED, RESCUE INHALER: FOR SHORTNESS OF BREATH, USE IN PLACE OF ALBUTEROL NEBS Patient not taking: Reported on 05/31/2015 10/04/13   Doreene Eland, MD    Family History History reviewed. No pertinent family history.  Social History Social History  Substance Use Topics  . Smoking status: Former Smoker    Quit date: 03/01/2014  . Smokeless tobacco: Never Used     Comment: Mother smokes  . Alcohol use 1.8 oz/week    3 Standard drinks or equivalent per week     Allergies   Patient has no known allergies.   Review of Systems Review of Systems  Constitutional: Negative for chills and fever.  Respiratory: Negative for shortness of breath.   Cardiovascular: Negative for chest pain.  Gastrointestinal: Positive for abdominal pain, diarrhea, nausea and vomiting. Negative for blood in stool, constipation, flatus, hematochezia and melena.  Genitourinary: Negative for dysuria, hematuria, vaginal bleeding and vaginal discharge.  Musculoskeletal: Negative for arthralgias and myalgias.  Skin: Negative for color change.  Allergic/Immunologic: Negative for immunocompromised state.  Neurological: Negative for weakness and numbness.  Psychiatric/Behavioral: Negative for confusion.   All other systems reviewed and are negative for acute change except as noted in the HPI.    Physical Exam Updated Vital Signs BP 137/89 (BP Location: Left Arm)   Pulse 84   Temp 98.6 F (37 C)  (Oral)   Resp 16   LMP 11/11/2016   SpO2 100%   Physical Exam  Constitutional: She is oriented to person, place, and time. Vital signs are normal. She appears well-developed and well-nourished.  Non-toxic appearance. No distress.  Afebrile, nontoxic, NAD  HENT:  Head: Normocephalic and atraumatic.  Mouth/Throat: Oropharynx is clear and moist. Mucous membranes are dry.  Dry lips  Eyes: Conjunctivae and EOM are normal. Right eye exhibits no discharge. Left eye exhibits no discharge.  Neck: Normal range of motion. Neck supple.  Cardiovascular: Normal rate, regular rhythm, normal heart sounds and intact distal pulses.  Exam reveals no gallop and no friction rub.   No murmur heard. Pulmonary/Chest: Effort normal and breath sounds normal. No respiratory distress. She has no decreased breath sounds. She has no wheezes. She has no rhonchi. She has no rales.  Abdominal: Soft. Normal appearance and bowel sounds are normal. She exhibits no distension. There is tenderness in the suprapubic area. There is no rigidity, no rebound, no guarding, no CVA tenderness, no tenderness at McBurney's point and negative Murphy's sign.  Soft, nondistended, +BS throughout, with very mild suprapubic TTP, no r/g/r, neg murphy's, neg mcburney's, no CVA TTP  Musculoskeletal: Normal range of motion.  Neurological: She is alert and oriented to person, place, and time. She has normal strength. No sensory deficit.  Skin: Skin is warm, dry and intact. No rash noted.  Psychiatric: She has a normal mood and affect.  Nursing note and vitals reviewed.    ED Treatments / Results  Labs (all labs ordered are listed, but only abnormal results are displayed) Labs Reviewed  COMPREHENSIVE METABOLIC PANEL - Abnormal; Notable for the following:       Result Value   Glucose, Bld 103 (*)    All other components within normal limits  URINALYSIS, ROUTINE W REFLEX MICROSCOPIC - Abnormal; Notable for the following:    pH 9.0 (*)     Hgb urine dipstick SMALL (*)    Protein, ur >=300 (*)    Squamous Epithelial / LPF 0-5 (*)    All other components within normal limits  LIPASE, BLOOD  CBC  PREGNANCY, URINE  POC URINE PREG, ED    EKG  EKG Interpretation None       Radiology No results found.  Procedures Procedures (including critical care time)  Medications Ordered in ED Medications  ondansetron (ZOFRAN-ODT) 4 MG disintegrating tablet (not administered)  ondansetron (ZOFRAN-ODT) disintegrating tablet 4 mg (4 mg Oral Given 11/11/16 1253)  sodium chloride 0.9 % bolus 1,000 mL (1,000 mLs Intravenous New Bag/Given 11/11/16 1550)  promethazine (PHENERGAN) injection 12.5 mg (12.5 mg Intravenous Given 11/11/16 1546)     Initial Impression / Assessment and Plan / ED Course  I have reviewed the triage vital signs and the nursing notes.  Pertinent labs & imaging results that were available during my care of the patient were reviewed by me and considered in my medical decision making (see chart for details).     21 y.o. female here with n/v/d onset 6:30am today, some mild intermittent suprapubic pain as well. On exam, very mild suprapubic TTP, nonperitoneal, neg murphy's or mcburney's; dry lips. Work up thus far: U/A with evidence of dehydration, and slightly contaminated with vaginal menstrual blood, but no evidence of UTI; Upreg neg; lipase WNL; CMP and CBC WNL. Doubt need for imaging or further labs, doubt need for pelvic exam, likely viral gastroenteritis. Pt given zofran with mild relief but states still slightly nauseated; Will give IVFs and phenergan, and PO challenge afterwards. Pt declines wanting anything for pain. Will reassess shortly.   5:12 PM Pt feeling better and tolerating PO well, ready to go home. Will send home with zofran and phenergan, since both seemed to help moderately; advised BRAT diet, and other OTC remedies for symptomatic relief. F/up with PCP in 1wk for recheck. I explained the diagnosis  and have given explicit precautions to return to the ER including for any other new or worsening symptoms. The patient understands and accepts the medical plan as it's been dictated and I have answered their questions. Discharge instructions concerning home care and prescriptions have been given. The patient is STABLE and is discharged to home in good condition.    Final Clinical Impressions(s) / ED Diagnoses   Final diagnoses:  Nausea vomiting and diarrhea  Viral gastroenteritis  Suprapubic abdominal pain    New Prescriptions New Prescriptions   ONDANSETRON (ZOFRAN ODT) 4 MG DISINTEGRATING TABLET    Take 1 tablet (4 mg total) by mouth every 8 (eight) hours as needed for nausea or vomiting.   PROMETHAZINE (PHENERGAN) 25 MG TABLET    Take 1 tablet (25 mg total) by mouth every  6 (six) hours as needed for nausea or vomiting.     [address] [place], [place] 11/11/16 1712    Cathren Laine, MD 11/12/16 [phone]

## 2016-11-11 NOTE — ED Triage Notes (Signed)
Pt to ER for nausea, vomiting, and diarrhea that began this morning at 7 am, states 5 episodes of vomiting and 3 diarrhea. A/o x4.

## 2016-11-11 NOTE — ED Notes (Signed)
Pt given water 

## 2016-11-11 NOTE — Discharge Instructions (Addendum)
Use phenergan and/or zofran as prescribed, as needed for nausea. Alternate between tylenol and motrin as needed for pain. May consider using over the counter tums, maalox, pepto bismol, or other over the counter remedies to help with symptoms. Stay well hydrated with small sips of fluids throughout the day. Follow a BRAT (banana-rice-applesauce-toast) diet as described below for the next 24-48 hours. The 'BRAT' diet is suggested, then progress to diet as tolerated as symptoms abate. Call your regular doctor if bloody stools, persistent diarrhea, vomiting, fever or abdominal pain. Follow up with your regular doctor in 1 week for recheck of symptoms. Return to ER for changing or worsening of symptoms.

## 2017-02-12 ENCOUNTER — Emergency Department (HOSPITAL_COMMUNITY)
Admission: EM | Admit: 2017-02-12 | Discharge: 2017-02-12 | Discharge status: Against Medical Advice | Payer: Self-pay | Attending: Emergency Medicine | Admitting: Emergency Medicine

## 2017-02-12 ENCOUNTER — Encounter (HOSPITAL_COMMUNITY): Payer: Self-pay | Admitting: Emergency Medicine

## 2017-02-12 DIAGNOSIS — Z5321 Procedure and treatment not carried out due to patient leaving prior to being seen by health care provider: Secondary | ICD-10-CM | POA: Insufficient documentation

## 2017-02-12 DIAGNOSIS — K0889 Other specified disorders of teeth and supporting structures: Secondary | ICD-10-CM | POA: Insufficient documentation

## 2017-02-12 NOTE — ED Triage Notes (Signed)
Patient here from home with complaints of right lower dental pain "for months". Reports Aleve with no relief.

## 2017-02-12 NOTE — ED Notes (Signed)
No answer from lobby  

## 2017-02-12 NOTE — ED Notes (Signed)
Bed: WTR6 Expected date:  Expected time:  Means of arrival:  Comments: 

## 2017-02-12 NOTE — ED Notes (Signed)
Bed: WTR5 Expected date:  Expected time:  Means of arrival:  Comments: 

## 2018-07-05 DIAGNOSIS — R61 Generalized hyperhidrosis: Secondary | ICD-10-CM | POA: Insufficient documentation

## 2019-03-11 ENCOUNTER — Telehealth: Payer: Self-pay

## 2019-03-11 ENCOUNTER — Ambulatory Visit (HOSPITAL_COMMUNITY)
Admission: EM | Admit: 2019-03-11 | Discharge: 2019-03-11 | Disposition: A | Discharge status: Home / Self Care | Payer: Medicaid Other | Attending: Family Medicine | Admitting: Family Medicine

## 2019-03-11 ENCOUNTER — Encounter (HOSPITAL_COMMUNITY): Payer: Self-pay

## 2019-03-11 ENCOUNTER — Telehealth: Payer: Medicaid Other

## 2019-03-11 ENCOUNTER — Other Ambulatory Visit: Payer: Self-pay

## 2019-03-11 DIAGNOSIS — B9689 Other specified bacterial agents as the cause of diseases classified elsewhere: Secondary | ICD-10-CM

## 2019-03-11 DIAGNOSIS — N760 Acute vaginitis: Secondary | ICD-10-CM

## 2019-03-11 MED ORDER — METRONIDAZOLE 500 MG PO TABS: 500.0000 mg | ORAL_TABLET | Freq: Two times a day (BID) | ORAL | 0 refills | Status: DC

## 2019-03-11 NOTE — Discharge Instructions (Addendum)
Consider a probiotic for women to prevent vaginal infections

## 2019-03-11 NOTE — ED Provider Notes (Signed)
[facility]    CSN: [id number] Arrival date & time: 03/11/19  1547      History   Chief Complaint Chief Complaint  Patient presents with  . Vaginal Discharge    HPI Kelly Henry is a 24 y.o. female.   HPI  Patient is here for treatment of BV She states she has had this a couple times before, but not for a couple of years States she has not been sexually active since last July and is not concerned about STD States she has been using a new soap and thinks this might be irritating her skin We discussed recurring BV  Past Medical History:  Diagnosis Date  . Asthma     Patient Active Problem List   Diagnosis Date Noted  . 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 Genital labial ulcer 04/24/2014  . BV (bacterial vaginosis) 04/24/2014  . Trichomonas vaginitis 04/24/2014  . Eczema 09/18/2008  . Asthma, intermittent 02/06/2008    History reviewed. No pertinent surgical history.  OB History    Gravida  0   Para  0   Term  0   Preterm  0   AB  0   Living  0     SAB  0   TAB  0   Ectopic  0   Multiple  0   Live Births               Home Medications    Prior to Admission medications   Medication Sig Start Date End Date Taking? Authorizing Provider  glycopyrrolate (ROBINUL) 1 MG tablet Take 1 mg by mouth 3 (three) times daily.   Yes [provider]  metroNIDAZOLE (FLAGYL) 500 MG tablet Take 1 tablet (500 mg total) by mouth 2 (two) times daily. 03/11/19   Eustace Moore, MD    Family History History reviewed. No pertinent family history.  Social History Social History   Tobacco Use  . Smoking status: Former Smoker    Quit date: 03/01/2014    Years since quitting: 5.0  . Smokeless tobacco: Never Used  . Tobacco comment: Mother smokes  Substance Use Topics  . Alcohol use: Yes    Alcohol/week: 3.0 standard drinks    Types: 3 Standard drinks or equivalent per week  . Drug use: No     Allergies   Patient  has no known allergies.   Review of Systems Review of Systems  Constitutional: Negative for chills and fever.  Genitourinary: Positive for vaginal discharge. Negative for menstrual problem and pelvic pain.     Physical Exam Triage Vital Signs ED Triage Vitals  Enc Vitals Group     BP 03/11/19 1557 109/61     Pulse Rate 03/11/19 1557 73     Resp 03/11/19 1557 15     Temp 03/11/19 1557 98.1 F (36.7 C)     Temp Source 03/11/19 1557 Oral     SpO2 03/11/19 1557 100 %     Weight --      Height --      Head Circumference --      Peak Flow --      Pain Score 03/11/19 1555 0     Pain Loc --      Pain Edu? --      Excl. in GC? --    No data found.  Updated Vital Signs BP 109/61 (BP Location: Right Arm)   Pulse 73   Temp 98.1 F (36.7 C) (  Oral)   Resp 15   LMP  (Within Weeks) Comment: 1 week   SpO2 100%   Visual Acuity Right Eye Distance:   Left Eye Distance:   Bilateral Distance:    Right Eye Near:   Left Eye Near:    Bilateral Near:     Physical Exam Constitutional:      General: She is not in acute distress.    Appearance: She is well-developed.  HENT:     Head: Normocephalic and atraumatic.  Eyes:     Conjunctiva/sclera: Conjunctivae normal.     Pupils: Pupils are equal, round, and reactive to light.  Cardiovascular:     Rate and Rhythm: Normal rate.  Pulmonary:     Effort: Pulmonary effort is normal. No respiratory distress.  Musculoskeletal:        General: Normal range of motion.     Cervical back: Normal range of motion.  Skin:    General: Skin is warm and dry.  Neurological:     Mental Status: She is alert.  Psychiatric:        Mood and Affect: Mood normal.        Behavior: Behavior normal.      UC Treatments / Results  Labs (all labs ordered are listed, but only abnormal results are displayed) Labs Reviewed - No data to display  EKG   Radiology No results found.  Procedures Procedures (including critical care time)   Medications Ordered in UC Medications - No data to display  Initial Impression / Assessment and Plan / UC Course  I have reviewed the triage vital signs and the nursing notes.  Pertinent labs & imaging results that were available during my care of the patient were reviewed by me and considered in my medical decision making (see chart for details).     BV discussed.  Prevention.  Treatment. Final Clinical Impressions(s) / UC Diagnoses   Final diagnoses:  BV (bacterial vaginosis)     Discharge Instructions     Consider a probiotic for women to prevent vaginal infections    ED Prescriptions    Medication Sig Dispense Auth. Provider   metroNIDAZOLE (FLAGYL) 500 MG tablet Take 1 tablet (500 mg total) by mouth 2 (two) times daily. 14 tablet Raylene Everts, MD     PDMP not reviewed this encounter.   Raylene Everts, MD 03/11/19 [phone]

## 2019-03-11 NOTE — ED Triage Notes (Signed)
Pt presents to UC with yellow vaginal discharge with fishy odor x 3 days.

## 2019-06-11 ENCOUNTER — Other Ambulatory Visit: Payer: Self-pay

## 2019-06-11 ENCOUNTER — Ambulatory Visit (HOSPITAL_COMMUNITY)
Admission: EM | Admit: 2019-06-11 | Discharge: 2019-06-11 | Disposition: A | Discharge status: Home / Self Care | Payer: Medicaid Other | Attending: Internal Medicine | Admitting: Internal Medicine

## 2019-06-11 DIAGNOSIS — A084 Viral intestinal infection, unspecified: Secondary | ICD-10-CM | POA: Insufficient documentation

## 2019-06-11 DIAGNOSIS — Z87891 Personal history of nicotine dependence: Secondary | ICD-10-CM | POA: Insufficient documentation

## 2019-06-11 DIAGNOSIS — Z20822 Contact with and (suspected) exposure to covid-19: Secondary | ICD-10-CM | POA: Insufficient documentation

## 2019-06-11 LAB — CBC WITH DIFFERENTIAL/PLATELET
Abs Immature Granulocytes: 0.02 10*3/uL (ref 0.00–0.07)
Basophils Absolute: 0 10*3/uL (ref 0.0–0.1)
Basophils Relative: 0 %
Eosinophils Absolute: 0 10*3/uL (ref 0.0–0.5)
Eosinophils Relative: 0 %
HCT: 37.2 % (ref 36.0–46.0)
Hemoglobin: 12.6 g/dL (ref 12.0–15.0)
Immature Granulocytes: 0 %
Lymphocytes Relative: 9 %
Lymphs Abs: 0.7 10*3/uL (ref 0.7–4.0)
MCH: 29.6 pg (ref 26.0–34.0)
MCHC: 33.9 g/dL (ref 30.0–36.0)
MCV: 87.5 fL (ref 80.0–100.0)
Monocytes Absolute: 0.3 10*3/uL (ref 0.1–1.0)
Monocytes Relative: 4 %
Neutro Abs: 6.1 10*3/uL (ref 1.7–7.7)
Neutrophils Relative %: 87 %
Platelets: 334 10*3/uL (ref 150–400)
RBC: 4.25 MIL/uL (ref 3.87–5.11)
RDW: 12.1 % (ref 11.5–15.5)
WBC: 7 10*3/uL (ref 4.0–10.5)
nRBC: 0 % (ref 0.0–0.2)

## 2019-06-11 LAB — COMPREHENSIVE METABOLIC PANEL
ALT: 28 U/L (ref 0–44)
AST: 27 U/L (ref 15–41)
Albumin: 4.4 g/dL (ref 3.5–5.0)
Alkaline Phosphatase: 42 U/L (ref 38–126)
Anion gap: 12 (ref 5–15)
BUN: 8 mg/dL (ref 6–20)
CO2: 20 mmol/L — ABNORMAL LOW (ref 22–32)
Calcium: 9.5 mg/dL (ref 8.9–10.3)
Chloride: 107 mmol/L (ref 98–111)
Creatinine, Ser: 0.67 mg/dL (ref 0.44–1.00)
GFR calc Af Amer: >60 mL/min (ref >60)
GFR calc non Af Amer: >60 mL/min (ref >60)
Glucose, Bld: 123 mg/dL — ABNORMAL HIGH (ref 70–99)
Potassium: 3.3 mmol/L — ABNORMAL LOW (ref 3.5–5.1)
Sodium: 139 mmol/L (ref 135–145)
Total Bilirubin: 0.6 mg/dL (ref 0.3–1.2)
Total Protein: 6.9 g/dL (ref 6.5–8.1)

## 2019-06-11 MED ORDER — ONDANSETRON 4 MG PO TBDP: 4.0000 mg | ORAL_TABLET | Freq: Three times a day (TID) | ORAL | 0 refills | Status: DC | PRN

## 2019-06-11 MED ORDER — ONDANSETRON 4 MG PO TBDP
ORAL_TABLET | ORAL | Status: AC
  Filled 2019-06-11: qty 1

## 2019-06-11 MED ORDER — ALUM & MAG HYDROXIDE-SIMETH 200-200-20 MG/5ML PO SUSP
30.0000 mL | Freq: Once | ORAL | Status: AC
  Administered 2019-06-11: 13:00:00 30 mL via ORAL

## 2019-06-11 MED ORDER — SODIUM CHLORIDE 0.9 % IV BOLUS
1000.0000 mL | Freq: Once | INTRAVENOUS | Status: AC
  Administered 2019-06-11: 1000 mL via INTRAVENOUS

## 2019-06-11 MED ORDER — ONDANSETRON 4 MG PO TBDP
4.0000 mg | ORAL_TABLET | Freq: Once | ORAL | Status: AC
  Administered 2019-06-11: 12:00:00 4 mg via ORAL

## 2019-06-11 MED ORDER — PANTOPRAZOLE SODIUM 20 MG PO TBEC: 20.0000 mg | DELAYED_RELEASE_TABLET | Freq: Every day | ORAL | 0 refills | Status: DC

## 2019-06-11 MED ORDER — ALUM & MAG HYDROXIDE-SIMETH 200-200-20 MG/5ML PO SUSP
ORAL | Status: AC
  Filled 2019-06-11: qty 30

## 2019-06-11 NOTE — ED Triage Notes (Signed)
Woke up today vomiting and diarrhea.

## 2019-06-11 NOTE — ED Notes (Signed)
Pt did not answered when call at the waiting room.

## 2019-06-12 LAB — SARS CORONAVIRUS 2 (TAT 6-24 HRS): SARS Coronavirus 2: NEGATIVE

## 2019-06-12 NOTE — ED Provider Notes (Signed)
[facility]    CSN: [id number] Arrival date & time: 06/11/19  1057      History   Chief Complaint Chief Complaint  Patient presents with  . Emesis    HPI Kelly Henry is a 24 y.o. female comes to urgent care with a 1 day history of abdominal pain, nausea, vomiting and diarrhea.  Symptoms started today and has been persistent.  She has had several episodes of nonbloody, nonbilious vomiting.  No fever or chills.  Poor oral intake.  No sick contacts.  No loss of taste or smell.Marland Kitchen   HPI  Past Medical History:  Diagnosis Date  . Asthma     Patient Active Problem List   Diagnosis Date Noted  . 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 Genital labial ulcer 04/24/2014  . BV (bacterial vaginosis) 04/24/2014  . Trichomonas vaginitis 04/24/2014  . Eczema 09/18/2008  . Asthma, intermittent 02/06/2008    No past surgical history on file.  OB History    Gravida  0   Para  0   Term  0   Preterm  0   AB  0   Living  0     SAB  0   TAB  0   Ectopic  0   Multiple  0   Live Births               Home Medications    Prior to Admission medications   Medication Sig Start Date End Date Taking? Authorizing Provider  glycopyrrolate (ROBINUL) 1 MG tablet Take 1 mg by mouth 3 (three) times daily.   Yes [provider]  metroNIDAZOLE (FLAGYL) 500 MG tablet Take 1 tablet (500 mg total) by mouth 2 (two) times daily. 03/11/19   Raylene Everts, MD  ondansetron (ZOFRAN ODT) 4 MG disintegrating tablet Take 1 tablet (4 mg total) by mouth every 8 (eight) hours as needed for nausea or vomiting. 06/11/19   Cleo Santucci, Myrene Galas, MD  pantoprazole (PROTONIX) 20 MG tablet Take 1 tablet (20 mg total) by mouth daily. 06/11/19 07/11/19  Chase Picket, MD  famotidine (PEPCID) 20 MG tablet Take 1 tablet (20 mg total) by mouth 2 (two) times daily. Patient not taking: Reported on 05/31/2015 09/14/14 03/11/19  Dalia Heading, PA-C  promethazine (PHENERGAN) 25 MG  tablet Take 1 tablet (25 mg total) by mouth every 6 (six) hours as needed for nausea or vomiting. 11/11/16 03/11/19  Street, [place], PA-C  QVAR 40 MCG/ACT inhaler inhale 1 puff by mouth twice a day for asthma Patient not taking: Reported on 09/14/2014 01/15/12 03/11/19  Katherina Mires, MD  sucralfate (CARAFATE) 1 G tablet Take 1 tablet (1 g total) by mouth 4 (four) times daily. Patient not taking: Reported on 05/31/2015 09/14/14 03/11/19  Lawyer, Harrell Gave, PA-C  VENTOLIN HFA 108 (90 BASE) MCG/ACT inhaler INHALE 2 PUFFS INTO THE LUNGS EVERY 4 HOURS AS NEEDED, RESCUE INHALER: FOR SHORTNESS OF BREATH, USE IN PLACE OF ALBUTEROL NEBS Patient not taking: Reported on 05/31/2015 10/04/13 03/11/19  Kinnie Feil, MD    Family History No family history on file.  Social History Social History   Tobacco Use  . Smoking status: Former Smoker    Quit date: 03/01/2014    Years since quitting: 5.2  . Smokeless tobacco: Never Used  . Tobacco comment: Mother smokes  Substance Use Topics  . Alcohol use: Yes    Alcohol/week: 3.0 standard drinks    Types: 3 Standard drinks or  equivalent per week  . Drug use: No     Allergies   Patient has no known allergies.   Review of Systems Review of Systems  Constitutional: Positive for activity change, chills and fatigue. Negative for fever.  HENT: Negative.   Respiratory: Negative.   Gastrointestinal: Positive for abdominal pain, diarrhea, nausea and vomiting.  Genitourinary: Negative.   Musculoskeletal: Negative.   Skin: Negative.   Neurological: Negative for dizziness, light-headedness and headaches.  Psychiatric/Behavioral: Negative.  Negative for confusion and decreased concentration.     Physical Exam Triage Vital Signs ED Triage Vitals [06/11/19 1201]  Enc Vitals Group     BP 123/71     Pulse Rate 84     Resp 16     Temp 100.1 F (37.8 C)     Temp src      SpO2 100 %     Weight      Height      Head Circumference      Peak Flow       Pain Score 10     Pain Loc      Pain Edu?      Excl. in GC?    No data found.  Updated Vital Signs BP 123/71   Pulse 84   Temp 100.1 F (37.8 C)   Resp 16   LMP 06/09/2019   SpO2 100%   Visual Acuity Right Eye Distance:   Left Eye Distance:   Bilateral Distance:    Right Eye Near:   Left Eye Near:    Bilateral Near:     Physical Exam Constitutional:      Appearance: Normal appearance. She is not ill-appearing.  Cardiovascular:     Rate and Rhythm: Normal rate and regular rhythm.  Pulmonary:     Effort: Pulmonary effort is normal.     Breath sounds: Normal breath sounds.  Abdominal:     General: Bowel sounds are normal. There is no distension.     Palpations: There is no mass.     Tenderness: There is abdominal tenderness. There is no right CVA tenderness, left CVA tenderness, guarding or rebound.     Hernia: No hernia is present.  Musculoskeletal:        General: Normal range of motion.  Skin:    General: Skin is warm.     Capillary Refill: Capillary refill takes less than 2 seconds.     Findings: No bruising or erythema.  Neurological:     General: No focal deficit present.     Mental Status: She is alert and oriented to person, place, and time.      UC Treatments / Results  Labs (all labs ordered are listed, but only abnormal results are displayed) Labs Reviewed  COMPREHENSIVE METABOLIC PANEL - Abnormal; Notable for the following components:      Result Value   Potassium 3.3 (*)    CO2 20 (*)    Glucose, Bld 123 (*)    All other components within normal limits  SARS CORONAVIRUS 2 (TAT 6-24 HRS)  CBC WITH DIFFERENTIAL/PLATELET    EKG   Radiology No results found.  Procedures Procedures (including critical care time)  Medications Ordered in UC Medications  ondansetron (ZOFRAN-ODT) disintegrating tablet 4 mg (4 mg Oral Given 06/11/19 1207)  alum & mag hydroxide-simeth (MAALOX/MYLANTA) 200-200-20 MG/5ML suspension 30 mL (30 mLs Oral Given  06/11/19 1308)  sodium chloride 0.9 % bolus 1,000 mL (0 mLs Intravenous Stopped 06/11/19 1358)  Initial Impression / Assessment and Plan / UC Course  I have reviewed the triage vital signs and the nursing notes.  Pertinent labs & imaging results that were available during my care of the patient were reviewed by me and considered in my medical decision making (see chart for details).     1.  Viral gastroenteritis: Zofran ODT 4 mg x 1 dose Normal saline 1 L bolus Protonix 20 mg orally daily Zofran ODT 4 mg every 8 hours as needed for nausea/vomiting CBC, BMP Urine pregnancy test is negative COVID-19 test has been sent Patient is advised to push oral fluid intake Return precautions given. Final Clinical Impressions(s) / UC Diagnoses   Final diagnoses:  Viral gastroenteritis   Discharge Instructions   None    ED Prescriptions    Medication Sig Dispense Auth. Provider   pantoprazole (PROTONIX) 20 MG tablet Take 1 tablet (20 mg total) by mouth daily. 30 tablet Erbie Arment, Britta Mccreedy, MD   ondansetron (ZOFRAN ODT) 4 MG disintegrating tablet Take 1 tablet (4 mg total) by mouth every 8 (eight) hours as needed for nausea or vomiting. 20 tablet Roe Wilner, Britta Mccreedy, MD     PDMP not reviewed this encounter.   Merrilee Jansky, MD 06/12/19 [phone]

## 2019-10-30 DIAGNOSIS — Z01419 Encounter for gynecological examination (general) (routine) without abnormal findings: Secondary | ICD-10-CM | POA: Diagnosis not present

## 2019-10-30 DIAGNOSIS — Z3009 Encounter for other general counseling and advice on contraception: Secondary | ICD-10-CM | POA: Diagnosis not present

## 2019-10-30 DIAGNOSIS — N760 Acute vaginitis: Secondary | ICD-10-CM | POA: Diagnosis not present

## 2020-02-16 DIAGNOSIS — Z1152 Encounter for screening for COVID-19: Secondary | ICD-10-CM | POA: Diagnosis not present

## 2020-05-27 ENCOUNTER — Encounter: Payer: Self-pay | Admitting: General Practice

## 2020-06-06 ENCOUNTER — Other Ambulatory Visit: Payer: Self-pay

## 2020-06-06 ENCOUNTER — Encounter (HOSPITAL_COMMUNITY): Payer: Self-pay | Admitting: Obstetrics and Gynecology

## 2020-06-06 ENCOUNTER — Inpatient Hospital Stay (HOSPITAL_COMMUNITY): Payer: 59

## 2020-06-06 ENCOUNTER — Inpatient Hospital Stay (HOSPITAL_COMMUNITY)
Admission: AD | Admit: 2020-06-06 | Discharge: 2020-06-06 | Disposition: A | Discharge status: Home / Self Care | Payer: 59 | Attending: Obstetrics and Gynecology | Admitting: Obstetrics and Gynecology

## 2020-06-06 DIAGNOSIS — Z87891 Personal history of nicotine dependence: Secondary | ICD-10-CM | POA: Diagnosis not present

## 2020-06-06 DIAGNOSIS — O219 Vomiting of pregnancy, unspecified: Secondary | ICD-10-CM | POA: Insufficient documentation

## 2020-06-06 DIAGNOSIS — Z3A01 Less than 8 weeks gestation of pregnancy: Secondary | ICD-10-CM | POA: Diagnosis not present

## 2020-06-06 DIAGNOSIS — O26891 Other specified pregnancy related conditions, first trimester: Secondary | ICD-10-CM | POA: Diagnosis not present

## 2020-06-06 DIAGNOSIS — R102 Pelvic and perineal pain: Secondary | ICD-10-CM | POA: Insufficient documentation

## 2020-06-06 DIAGNOSIS — O26899 Other specified pregnancy related conditions, unspecified trimester: Secondary | ICD-10-CM

## 2020-06-06 DIAGNOSIS — R109 Unspecified abdominal pain: Secondary | ICD-10-CM

## 2020-06-06 DIAGNOSIS — Z3491 Encounter for supervision of normal pregnancy, unspecified, first trimester: Secondary | ICD-10-CM

## 2020-06-06 LAB — URINALYSIS, ROUTINE W REFLEX MICROSCOPIC
Bilirubin Urine: NEGATIVE
Glucose, UA: NEGATIVE mg/dL
Hgb urine dipstick: NEGATIVE
Ketones, ur: 5 mg/dL — AB
Leukocytes,Ua: NEGATIVE
Nitrite: NEGATIVE
Protein, ur: NEGATIVE mg/dL
Specific Gravity, Urine: 1.015 (ref 1.005–1.030)
pH: 6 (ref 5.0–8.0)

## 2020-06-06 LAB — POCT PREGNANCY, URINE: Preg Test, Ur: POSITIVE — AB

## 2020-06-06 MED ORDER — PROMETHAZINE HCL 25 MG PO TABS: 25.0000 mg | ORAL_TABLET | Freq: Four times a day (QID) | ORAL | 0 refills | Status: DC | PRN

## 2020-06-06 MED ORDER — DOXYLAMINE-PYRIDOXINE 10-10 MG PO TBEC: DELAYED_RELEASE_TABLET | ORAL | 0 refills | Status: DC

## 2020-06-06 NOTE — MAU Note (Signed)
Called OB gyn, asking about medication for nausea. Had phone visit, was told to come here. Not throwing up, just nausea. Has had some cramping, no bleeding, normal d/c. First appt is 5/12.

## 2020-06-06 NOTE — Discharge Instructions (Signed)
Return to care   If you have heavier bleeding that soaks through more that 2 pads per hour for an hour or more  If you bleed so much that you feel like you might pass out or you do pass out  If you have significant abdominal pain that is not improved with Tylenol    Morning Sickness  Morning sickness is when a woman feels nauseous during pregnancy. This nauseous feeling may or may not come with vomiting. It often occurs in the morning, but it can be a problem at any time of day. Morning sickness is most common during the first trimester. In some cases, it may continue throughout pregnancy. Although morning sickness is unpleasant, it is usually harmless unless the woman develops severe and continual vomiting (hyperemesis gravidarum), a condition that requires more intense treatment. What are the causes? The exact cause of this condition is not known, but it seems to be related to normal hormonal changes that occur in pregnancy. What increases the risk? You are more likely to develop this condition if:  You experienced nausea or vomiting before your pregnancy.  You had morning sickness during a previous pregnancy.  You are pregnant with more than one baby, such as twins. What are the signs or symptoms? Symptoms of this condition include:  Nausea.  Vomiting. How is this diagnosed? This condition is usually diagnosed based on your signs and symptoms. How is this treated? In many cases, treatment is not needed for this condition. Making some changes to what you eat may help to control symptoms. Your health care provider may also prescribe or recommend:  Vitamin B6 supplements.  Anti-nausea medicines.  Ginger. Follow these instructions at home: Medicines  Take over-the-counter and prescription medicines only as told by your health care provider. Do not use any prescription, over-the-counter, or herbal medicines for morning sickness without first talking with your health care  provider.  Take multivitamins before getting pregnant. This can prevent or decrease the severity of morning sickness in most women. Eating and drinking  Eat a piece of dry toast or crackers before getting out of bed in the morning.  Eat 5 or 6 small meals a day.  Eat dry and bland foods, such as rice or a baked potato. Foods that are high in carbohydrates are often helpful.  Avoid greasy, fatty, and spicy foods.  Have someone cook for you if the smell of any food causes nausea and vomiting.  If you feel nauseous after taking prenatal vitamins, take the vitamins at night or with a snack.  Eat a protein snack between meals if you are hungry. Nuts, yogurt, and cheese are good options.  Drink fluids throughout the day.  Try ginger ale made with real ginger, ginger tea made from fresh grated ginger, or ginger candies. General instructions  Do not use any products that contain nicotine or tobacco. These products include cigarettes, chewing tobacco, and vaping devices, such as e-cigarettes. If you need help quitting, ask your health care provider.  Get an air purifier to keep the air in your house free of odors.  Get plenty of fresh air.  Try to avoid odors that trigger your nausea.  Consider trying these methods to help relieve symptoms: ? Wearing an acupressure wristband. These wristbands are often worn for seasickness. ? Acupuncture. Contact a health care provider if:  Your home remedies are not working and you need medicine.  You feel dizzy or light-headed.  You are losing weight. Get help right away   if:  You have persistent and uncontrolled nausea and vomiting.  You faint.  You have severe pain in your abdomen. Summary  Morning sickness is when a woman feels nauseous during pregnancy. This nauseous feeling may or may not come with vomiting.  Morning sickness is most common during the first trimester.  It often occurs in the morning, but it can be a problem at any  time of day.  In many cases, treatment is not needed for this condition. Making some changes to what you eat may help to control symptoms. This information is not intended to replace advice given to you by your health care provider. Make sure you discuss any questions you have with your health care provider. Document Revised: 09/10/2019 Document Reviewed: 08/20/2019 Elsevier Patient Education  2021 Elsevier Inc.  

## 2020-06-06 NOTE — MAU Provider Note (Addendum)
History   Chief Complaint:  Abdominal Pain, Nausea, and Possible Pregnancy   Kelly Henry is  25 y.o. G1P0000 at [redacted]w[redacted]d by LMP. She presents complaining of Abdominal Pain, Nausea, and Possible Pregnancy  Pregnancy test was positive. She reports nausea over the last month that is worse in the mornings. She occasionally as nausea at night also. She has not tried anything for nausea. She is still able to eat and drink. Denies vomiting. She also endorses mild abdominal cramping that is intermittent. She denies vaginal bleeding, urinary symptoms, or abnormal discharge. She is still sexually active with the same partner. She denies fever, chills, diarrhea or constipation.    OB History    Gravida  1   Para  0   Term  0   Preterm  0   AB  0   Living  0     SAB  0   IAB  0   Ectopic  0   Multiple  0   Live Births               Past Medical History:  Diagnosis Date  . Asthma     Past Surgical History:  Procedure Laterality Date  . NO PAST SURGERIES      History reviewed. No pertinent family history.  Social History   Tobacco Use  . Smoking status: Former Smoker    Quit date: 03/01/2014    Years since quitting: 6.2  . Smokeless tobacco: Never Used  . Tobacco comment: Mother smokes  Substance Use Topics  . Alcohol use: Not Currently    Alcohol/week: 3.0 standard drinks    Types: 3 Standard drinks or equivalent per week  . Drug use: Not Currently    Types: Marijuana    Allergies: No Known Allergies  No medications prior to admission.    Review of Systems - Negative except for those listed in the HPI  Physical Exam   Blood pressure 138/70, pulse 82, temperature 98.3 F (36.8 C), temperature source Oral, resp. rate 16, height 5\' 2"  (1.575 m), weight 56.1 kg, last menstrual period 04/13/2020, SpO2 100 %.  General: General appearance - alert, well appearing, and in no distress Heart - normal rate and regular rhythm Abdomen - soft, nontender,  nondistended, no masses or organomegaly Skin - normal coloration and turgor, no rashes, no suspicious skin lesions noted  Labs: Results for orders placed or performed during the hospital encounter of 06/06/20 (from the past 24 hour(s))  Urinalysis, Routine w reflex microscopic   Collection Time: 06/06/20  2:47 PM  Result Value Ref Range   Color, Urine YELLOW YELLOW   APPearance CLEAR CLEAR   Specific Gravity, Urine 1.015 1.005 - 1.030   pH 6.0 5.0 - 8.0   Glucose, UA NEGATIVE NEGATIVE mg/dL   Hgb urine dipstick NEGATIVE NEGATIVE   Bilirubin Urine NEGATIVE NEGATIVE   Ketones, ur 5 (A) NEGATIVE mg/dL   Protein, ur NEGATIVE NEGATIVE mg/dL   Nitrite NEGATIVE NEGATIVE   Leukocytes,Ua NEGATIVE NEGATIVE  Pregnancy, urine POC   Collection Time: 06/06/20  2:50 PM  Result Value Ref Range   Preg Test, Ur POSITIVE (A) NEGATIVE    Ultrasound Studies:  IMPRESSION: Single intrauterine gestational sac measuring 5 weeks 6 days by mean sac diameter. Suggest correlation with serial b-hCG levels, and consider followup ultrasound to assess viability in 10 days.   Assessment: Patient Active Problem List   Diagnosis Date Noted  . 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 Genital  labial ulcer 04/24/2014  . BV (bacterial vaginosis) 04/24/2014  . Trichomonas vaginitis 04/24/2014  . Eczema 09/18/2008  . Asthma, intermittent 02/06/2008   Kelly Henry is a 25 y.o. female G1P0 at 7weeks who presented for nausea and abdominal cramping. No vomiting, still able to eat and drink well. She is well appearing and well-hydrated. Vital signs are normal and she has no tenderness on abdominal exam. We discussed that nausea is common in pregnancy and can affect our appetite. I am reassured that she has no vomiting and is still eating and drinking well at home. We discussed importance of appropriate weight gain in the first trimester and management of N/V including medications and eating small meals more  frequently. We discussed signs and symptoms for which to return. Regarding abdominal cramping, she describes this as very mild, not occurring every day. DDX: ectopic pregnancy, normal pain of pregnancy, infection vs others. [name] ordered to rule out ectopic pregnancy which showed IUP measuring 5 weeks 6 days. She has not had any bleeding therefore we will not do labs at this time. She has her first prenatal appointment with Dr. Adrian Blackwater on 5/12. She was given a prescription for diclegis and phenergan and will return for worsening symptoms.    Plan: 1. Nausea w/o Vomiting  Diclegis qPM. Can add in AM if not helping. Phenergan rescue.   2. Abdominal Cramping  US showed IUP measuring 5 weeks 6 days   3. Supervision of low risk pregnancy  First prenatal appt on 5/12  Desires water birth   Discharge Medications: Allergies as of 06/06/2020   No Known Allergies     Medication List    STOP taking these medications   glycopyrrolate 1 MG tablet Commonly known as: ROBINUL   metroNIDAZOLE 500 MG tablet Commonly known as: FLAGYL   ondansetron 4 MG disintegrating tablet Commonly known as: Zofran ODT   pantoprazole 20 MG tablet Commonly known as: PROTONIX     TAKE these medications   Doxylamine-Pyridoxine 10-10 MG Tbec Take 2 tabs at bedtime. If needed, add another tab in the morning. If needed, add another tab in the afternoon, up to 4 tabs/day.   promethazine 25 MG tablet Commonly known as: PHENERGAN Take 1 tablet (25 mg total) by mouth every 6 (six) hours as needed for nausea or vomiting.        Dot Lanes 06/06/2020, 5:46 PM    Attestation of Supervision of Student:  I confirm that I have verified the information documented in the medical student's note and that I have also personally reperformed the history, physical exam and all medical decision making activities.  I have verified that all services and findings are accurately documented in this student's note; and I agree  with management and plan as outlined in the documentation. I have also made any necessary editorial changes.  HPI Kelly Henry is a 25 y.o. G1P0000 at [redacted]w[redacted]d who presents with nausea & abdominal cramping. Reports daily nausea, mainly in the morning. Denies vomiting. Has not tried medication for her symptoms. Also reports intermittent lower abdominal cramping for the last week. Rates pain 0/10 currently. Last felt cramping this morning. Denies fever, dysuria, vaginal bleeding, or vaginal discharge. Has New ob appt with Dr. Adrian Blackwater on 5/12.  Exam: BP 138/70 (BP Location: Right Arm)   Pulse 82   Temp 98.3 F (36.8 C) (Oral)   Resp 16   Ht 5\' 2"  (1.575 m)   Wt 56.1 kg   LMP 04/13/2020  SpO2 100%   BMI 22.61 kg/m   Physical Examination: General appearance - alert, well appearing, and in no distress and normal appearing weight Mental status - alert, oriented to person, place, and time, normal mood, behavior, speech, dress, motor activity, and thought processes Eyes - sclera anicteric Chest - normal respiratory effort Abdomen - soft, nontender, nondistended, no masses or organomegaly Skin - warm & dry   Results US OB LESS THAN 14 WEEKS WITH OB TRANSVAGINAL  Result Date: 06/06/2020 CLINICAL DATA:  Pelvic pain and cramping. EXAM: OBSTETRIC <14 WK [name] AND TRANSVAGINAL OB US TECHNIQUE: Both transabdominal and transvaginal ultrasound examinations were performed for complete evaluation of the gestation as well as the maternal uterus, adnexal regions, and pelvic cul-de-sac. Transvaginal technique was performed to assess early pregnancy. COMPARISON:  None. FINDINGS: Intrauterine gestational sac: Single Yolk sac:  Visualized. Embryo:  Not Visualized. MSD: 12 mm   5 w   6 d Subchorionic hemorrhage:  None visualized. Maternal uterus/adnexae: Both ovaries are normal in appearance. No mass or abnormal free fluid identified. IMPRESSION: Single intrauterine gestational sac measuring 5 weeks 6 days by mean sac  diameter. Suggest correlation with serial b-hCG levels, and consider followup ultrasound to assess viability in 10 days. Electronically Signed   By: Danae Orleans M.D.   On: 06/06/2020 16:20   Results for orders placed or performed during the hospital encounter of 06/06/20 (from the past 24 hour(s))  Urinalysis, Routine w reflex microscopic     Status: Abnormal   Collection Time: 06/06/20  2:47 PM  Result Value Ref Range   Color, Urine YELLOW YELLOW   APPearance CLEAR CLEAR   Specific Gravity, Urine 1.015 1.005 - 1.030   pH 6.0 5.0 - 8.0   Glucose, UA NEGATIVE NEGATIVE mg/dL   Hgb urine dipstick NEGATIVE NEGATIVE   Bilirubin Urine NEGATIVE NEGATIVE   Ketones, ur 5 (A) NEGATIVE mg/dL   Protein, ur NEGATIVE NEGATIVE mg/dL   Nitrite NEGATIVE NEGATIVE   Leukocytes,Ua NEGATIVE NEGATIVE  Pregnancy, urine POC     Status: Abnormal   Collection Time: 06/06/20  2:50 PM  Result Value Ref Range   Preg Test, Ur POSITIVE (A) NEGATIVE   MDM: Patient present with nausea but no vomiting & currently is asymptomatic. Discussed treatment at home. She is interested in diclegis. Will also rx phenergan.   Mild abdominal cramping that occurs daily. Ultrasound shows IUGS with yolk sac. Discussed that she is not as pregnant as initially thought by LMP. Will get viability/dating scan in 10-14 days to determine if her new ob appointment should be rescheduled.   A/P: 1. Nausea and vomiting during pregnancy prior to [redacted] weeks gestation  -rx diclegis -rx phenergan  2. Abdominal cramping affecting pregnancy  -reviewed return precautions -msg sent to [facility] for dating/viability ultrasound & to possibly reschedule new ob appointment  3. [redacted] weeks gestation of pregnancy   4. Normal IUP (intrauterine pregnancy) on prenatal ultrasound, first trimester      Judeth Horn, NP [facility] [employer] 06/06/2020 5:47 PM

## 2020-06-09 ENCOUNTER — Other Ambulatory Visit: Payer: Self-pay | Admitting: Obstetrics and Gynecology

## 2020-06-09 DIAGNOSIS — O26899 Other specified pregnancy related conditions, unspecified trimester: Secondary | ICD-10-CM

## 2020-06-09 DIAGNOSIS — R109 Unspecified abdominal pain: Secondary | ICD-10-CM

## 2020-06-11 ENCOUNTER — Telehealth: Payer: Self-pay | Admitting: Advanced Practice Midwife

## 2020-06-11 ENCOUNTER — Ambulatory Visit
Admission: RE | Admit: 2020-06-11 | Discharge: 2020-06-11 | Disposition: A | Discharge status: Home / Self Care | Payer: 59 | Source: Ambulatory Visit | Attending: Obstetrics and Gynecology | Admitting: Obstetrics and Gynecology

## 2020-06-11 ENCOUNTER — Other Ambulatory Visit: Payer: Self-pay

## 2020-06-11 DIAGNOSIS — O3680X0 Pregnancy with inconclusive fetal viability, not applicable or unspecified: Secondary | ICD-10-CM | POA: Diagnosis not present

## 2020-06-11 DIAGNOSIS — O26899 Other specified pregnancy related conditions, unspecified trimester: Secondary | ICD-10-CM | POA: Insufficient documentation

## 2020-06-11 DIAGNOSIS — R109 Unspecified abdominal pain: Secondary | ICD-10-CM | POA: Diagnosis not present

## 2020-06-11 DIAGNOSIS — Z3A01 Less than 8 weeks gestation of pregnancy: Secondary | ICD-10-CM | POA: Diagnosis not present

## 2020-06-11 NOTE — Telephone Encounter (Signed)
I called Kelly Henry today at 4:58 PM and confirmed patient's identity using two patient identifiers. [name] results from earlier today were reviewed. Patient is scheduled for new OB visit at 05/12 on [facility]. First trimester warning signs reviewed. Patient voiced understanding and had no further questions.   US OB Transvaginal  Result Date: 06/11/2020 CLINICAL DATA:  Viability EXAM: TRANSVAGINAL OB ULTRASOUND TECHNIQUE: Transvaginal ultrasound was performed for complete evaluation of the gestation as well as the maternal uterus, adnexal regions, and pelvic cul-de-sac. COMPARISON:  06/06/2020 FINDINGS: Intrauterine gestational sac: Single Yolk sac:  Visualized Embryo:  Visualized Cardiac Activity: Visualized Heart Rate: 119 bpm CRL: 6 mm   6 w 3 d                  [name] EDC: 02/01/2021 Subchorionic hemorrhage:  None visualized. Maternal uterus/adnexae: Right ovary measures 4.4 x 1.9 by 2.3 cm and contains corpus luteum. Left ovary measures 3.5 x 1.7 by 2.4 cm. Trace free fluid IMPRESSION: 1. Single viable intrauterine pregnancy with visualized embryo and estimated sonographic age of 6 weeks 3 days and ultrasound EDC of 02/01/2021. 2. Trace free fluid.  Otherwise no specific abnormality is seen Electronically Signed   By: Jasmine Pang M.D.   On: 06/11/2020 16:44    Calvert Cantor, CNM 06/11/2020 4:58 PM

## 2020-06-19 ENCOUNTER — Encounter: Payer: Medicaid Other | Admitting: Family Medicine

## 2020-06-20 ENCOUNTER — Encounter (HOSPITAL_COMMUNITY): Payer: Self-pay

## 2020-06-20 ENCOUNTER — Other Ambulatory Visit: Payer: Self-pay

## 2020-06-20 ENCOUNTER — Ambulatory Visit (HOSPITAL_COMMUNITY)
Admission: EM | Admit: 2020-06-20 | Discharge: 2020-06-20 | Disposition: A | Discharge status: Home / Self Care | Payer: 59 | Attending: Student | Admitting: Student

## 2020-06-20 DIAGNOSIS — Z3A09 9 weeks gestation of pregnancy: Secondary | ICD-10-CM | POA: Diagnosis not present

## 2020-06-20 DIAGNOSIS — Z113 Encounter for screening for infections with a predominantly sexual mode of transmission: Secondary | ICD-10-CM | POA: Insufficient documentation

## 2020-06-20 LAB — HIV ANTIBODY (ROUTINE TESTING W REFLEX): HIV Screen 4th Generation wRfx: NONREACTIVE

## 2020-06-20 NOTE — ED Provider Notes (Signed)
[facility]    CSN: [id number] Arrival date & time: 06/20/20  1441      History   Chief Complaint Chief Complaint  Patient presents with  . STD testing    HPI Kelly Henry is a 25 y.o. female presenting for STI testing, no symptoms. Her partner was recently intimate with another person and she is concerned for STI.  Medical history HSV, genital ulcer, BV, trichomonas, eczema, asthma.  She is currently about [redacted] weeks pregnant.  She denies absolutely any symptoms, including abdominal pain, back pain, vaginal discharge, fever/chills, vaginal spotting, dysuria, frequency, urgency.  HPI  Past Medical History:  Diagnosis Date  . Asthma     Patient Active Problem List   Diagnosis Date Noted  . 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 Genital labial ulcer 04/24/2014  . BV (bacterial vaginosis) 04/24/2014  . Trichomonas vaginitis 04/24/2014  . Eczema 09/18/2008  . Asthma, intermittent 02/06/2008    Past Surgical History:  Procedure Laterality Date  . NO PAST SURGERIES      OB History    Gravida  1   Para  0   Term  0   Preterm  0   AB  0   Living  0     SAB  0   IAB  0   Ectopic  0   Multiple  0   Live Births               Home Medications    Prior to Admission medications   Medication Sig Start Date End Date Taking? Authorizing Provider  Doxylamine-Pyridoxine 10-10 MG TBEC Take 2 tabs at bedtime. If needed, add another tab in the morning. If needed, add another tab in the afternoon, up to 4 tabs/day. 06/06/20   Judeth Horn, NP  promethazine (PHENERGAN) 25 MG tablet Take 1 tablet (25 mg total) by mouth every 6 (six) hours as needed for nausea or vomiting. 06/06/20   Judeth Horn, NP  famotidine (PEPCID) 20 MG tablet Take 1 tablet (20 mg total) by mouth 2 (two) times daily. Patient not taking: Reported on 05/31/2015 09/14/14 03/11/19  Charlestine Night, PA-C  QVAR 40 MCG/ACT inhaler inhale 1 puff by mouth twice a day for  asthma Patient not taking: Reported on 09/14/2014 01/15/12 03/11/19  Macy Mis, MD  sucralfate (CARAFATE) 1 G tablet Take 1 tablet (1 g total) by mouth 4 (four) times daily. Patient not taking: Reported on 05/31/2015 09/14/14 03/11/19  Lawyer, Cristal Deer, PA-C  VENTOLIN HFA 108 (90 BASE) MCG/ACT inhaler INHALE 2 PUFFS INTO THE LUNGS EVERY 4 HOURS AS NEEDED, RESCUE INHALER: FOR SHORTNESS OF BREATH, USE IN PLACE OF ALBUTEROL NEBS Patient not taking: Reported on 05/31/2015 10/04/13 03/11/19  Doreene Eland, MD    Family History History reviewed. No pertinent family history.  Social History Social History   Tobacco Use  . Smoking status: Former Smoker    Quit date: 03/01/2014    Years since quitting: 6.3  . Smokeless tobacco: Never Used  . Tobacco comment: Mother smokes  Substance Use Topics  . Alcohol use: Not Currently    Alcohol/week: 3.0 standard drinks    Types: 3 Standard drinks or equivalent per week  . Drug use: Not Currently    Types: Marijuana     Allergies   Patient has no known allergies.   Review of Systems Review of Systems  Constitutional: Negative for chills and fever.  HENT: Negative for sore throat.   Eyes:  Negative for pain and redness.  Respiratory: Negative for shortness of breath.   Cardiovascular: Negative for chest pain.  Gastrointestinal: Negative for abdominal pain, diarrhea, nausea and vomiting.  Genitourinary: Negative for decreased urine volume, difficulty urinating, dysuria, flank pain, frequency, genital sores, hematuria, urgency, vaginal bleeding, vaginal discharge and vaginal pain.  Musculoskeletal: Negative for back pain.  Skin: Negative for rash.  All other systems reviewed and are negative.    Physical Exam Triage Vital Signs ED Triage Vitals [06/20/20 1558]  Enc Vitals Group     BP      Pulse      Resp      Temp      Temp src      SpO2      Weight      Height      Head Circumference      Peak Flow      Pain Score 0     Pain  Loc      Pain Edu?      Excl. in GC?    No data found.  Updated Vital Signs BP 131/68   Pulse 96   Temp 99.4 F (37.4 C)   Resp 17   LMP 04/13/2020   SpO2 96%   Visual Acuity Right Eye Distance:   Left Eye Distance:   Bilateral Distance:    Right Eye Near:   Left Eye Near:    Bilateral Near:     Physical Exam Vitals reviewed.  Constitutional:      General: She is not in acute distress.    Appearance: Normal appearance. She is not ill-appearing.  HENT:     Head: Normocephalic and atraumatic.     Mouth/Throat:     Mouth: Mucous membranes are moist.     Comments: Moist mucous membranes Eyes:     Extraocular Movements: Extraocular movements intact.     Pupils: Pupils are equal, round, and reactive to light.  Cardiovascular:     Rate and Rhythm: Normal rate and regular rhythm.     Heart sounds: Normal heart sounds.  Pulmonary:     Effort: Pulmonary effort is normal.     Breath sounds: Normal breath sounds. No wheezing, rhonchi or rales.  Abdominal:     General: Bowel sounds are normal. There is no distension.     Palpations: Abdomen is soft. There is no mass.     Tenderness: There is no abdominal tenderness. There is no right CVA tenderness, left CVA tenderness, guarding or rebound.  Genitourinary:    Comments: deferred Skin:    General: Skin is warm.     Capillary Refill: Capillary refill takes less than 2 seconds.     Comments: Good skin turgor  Neurological:     General: No focal deficit present.     Mental Status: She is alert and oriented to person, place, and time.  Psychiatric:        Mood and Affect: Mood normal.        Behavior: Behavior normal.        Thought Content: Thought content normal.        Judgment: Judgment normal.      UC Treatments / Results  Labs (all labs ordered are listed, but only abnormal results are displayed) Labs Reviewed  RPR  HIV ANTIBODY (ROUTINE TESTING W REFLEX)  CERVICOVAGINAL ANCILLARY ONLY     EKG   Radiology No results found.  Procedures Procedures (including critical care time)  Medications Ordered in  UC Medications - No data to display  Initial Impression / Assessment and Plan / UC Course  I have reviewed the triage vital signs and the nursing notes.  Pertinent labs & imaging results that were available during my care of the patient were reviewed by me and considered in my medical decision making (see chart for details).     This patient is a 25 year old female presenting for STI screening following possible exposure to STI.  She is currently 9 months pregnant.  She is asymptomatic today, without any abdominal pain, back pain, vaginal spotting, vaginal discharge.  Afebrile and nontachycardic.  Self swab sent for gonorrhea, chlamydia, trichomonas, BV, yeast.  We will also send for HIV and syphilis.  We will defer treatment until results given no symptoms and she is pregnant.  Strict ED return precautions discussed.  Final Clinical Impressions(s) / UC Diagnoses   Final diagnoses:  Routine screening for STI (sexually transmitted infection)  [redacted] weeks gestation of pregnancy     Discharge Instructions     -We are screening you for gonorrhea, chlamydia, trichomonas, BV, yeast, HIV, syphilis.  If any of this is positive, we will call you in about 2 days and can send treatment if necessary. -If you develop new symptoms in the meantime, like abdominal pain, back pain, vaginal spotting, fever/chills, vaginal discharge-seek additional immediate medical attention    ED Prescriptions    None     PDMP not reviewed this encounter.   Rhys Martini, PA-C 06/20/20 [phone]

## 2020-06-20 NOTE — ED Triage Notes (Signed)
Pt in requesting STD testing after finding out a sexual partner was intimate with someone else  Denies any sxs

## 2020-06-20 NOTE — Discharge Instructions (Addendum)
-  We are screening you for gonorrhea, chlamydia, trichomonas, BV, yeast, HIV, syphilis.  If any of this is positive, we will call you in about 2 days and can send treatment if necessary. -If you develop new symptoms in the meantime, like abdominal pain, back pain, vaginal spotting, fever/chills, vaginal discharge-seek additional immediate medical attention

## 2020-06-21 LAB — RPR: RPR Ser Ql: NONREACTIVE

## 2020-06-23 LAB — CERVICOVAGINAL ANCILLARY ONLY
Bacterial Vaginitis (gardnerella): POSITIVE — AB
Candida Glabrata: NEGATIVE
Candida Vaginitis: NEGATIVE
Chlamydia: NEGATIVE
Comment: NEGATIVE
Comment: NEGATIVE
Comment: NEGATIVE
Comment: NEGATIVE
Comment: NEGATIVE
Comment: NORMAL
Neisseria Gonorrhea: NEGATIVE
Trichomonas: NEGATIVE

## 2020-06-26 ENCOUNTER — Telehealth (HOSPITAL_COMMUNITY): Payer: Self-pay | Admitting: Emergency Medicine

## 2020-06-26 MED ORDER — METRONIDAZOLE 0.75 % VA GEL: 1.0000 | Freq: Every day | VAGINAL | 0 refills | Status: AC

## 2020-07-24 ENCOUNTER — Other Ambulatory Visit: Payer: Self-pay

## 2020-07-24 ENCOUNTER — Encounter: Payer: Self-pay | Admitting: Family Medicine

## 2020-07-24 ENCOUNTER — Other Ambulatory Visit (HOSPITAL_COMMUNITY)
Admission: RE | Admit: 2020-07-24 | Discharge: 2020-07-24 | Disposition: A | Discharge status: Home / Self Care | Payer: 59 | Source: Ambulatory Visit | Attending: Family Medicine | Admitting: Family Medicine

## 2020-07-24 ENCOUNTER — Ambulatory Visit (INDEPENDENT_AMBULATORY_CARE_PROVIDER_SITE_OTHER): Payer: 59 | Admitting: Family Medicine

## 2020-07-24 VITALS — BP 134/79 | HR 98 | Wt 127.0 lb

## 2020-07-24 DIAGNOSIS — Z3401 Encounter for supervision of normal first pregnancy, first trimester: Secondary | ICD-10-CM

## 2020-07-24 DIAGNOSIS — Z3A12 12 weeks gestation of pregnancy: Secondary | ICD-10-CM | POA: Insufficient documentation

## 2020-07-24 DIAGNOSIS — Z3400 Encounter for supervision of normal first pregnancy, unspecified trimester: Secondary | ICD-10-CM | POA: Insufficient documentation

## 2020-07-24 LAB — OB RESULTS CONSOLE GC/CHLAMYDIA: Gonorrhea: NEGATIVE

## 2020-07-24 NOTE — Progress Notes (Signed)
Subjective:  Kelly Henry is a G1P0000 [redacted]w[redacted]d being seen today for her first obstetrical visit.  Her obstetrical history is significant for  First pregnancy. FOB involved. Planned pregnancy . Patient does intend to breast feed. Pregnancy history fully reviewed.  Patient reports no complaints.  BP 134/79   Pulse 98   Wt 127 lb (57.6 kg)   LMP 04/13/2020   BMI 23.23 kg/m   HISTORY: OB History  Gravida Para Term Preterm AB Living  1 0 0 0 0 0  SAB IAB Ectopic Multiple Live Births  0 0 0 0      # Outcome Date GA Lbr Len/2nd Weight Sex Delivery Anes PTL Lv  1 Current             Past Medical History:  Diagnosis Date   Asthma     Past Surgical History:  Procedure Laterality Date   NO PAST SURGERIES      History reviewed. No pertinent family history.   Exam  BP 134/79   Pulse 98   Wt 127 lb (57.6 kg)   LMP 04/13/2020   BMI 23.23 kg/m   Chaperone present during exam  CONSTITUTIONAL: Well-developed, well-nourished female in no acute distress.  HENT:  Normocephalic, atraumatic, External right and left ear normal. Oropharynx is clear and moist EYES: Conjunctivae and EOM are normal. Pupils are equal, round, and reactive to light. No scleral icterus.  NECK: Normal range of motion, supple, no masses.  Normal thyroid.  CARDIOVASCULAR: Normal heart rate noted, regular rhythm RESPIRATORY: Clear to auscultation bilaterally. Effort and breath sounds normal, no problems with respiration noted. BREASTS: Symmetric in size. No masses, skin changes, nipple drainage, or lymphadenopathy. ABDOMEN: Soft, normal bowel sounds, no distention noted.  No tenderness, rebound or guarding.  PELVIC: Normal appearing external genitalia; normal appearing vaginal mucosa and cervix. No abnormal discharge noted. Normal uterine size, no other palpable masses, no uterine or adnexal tenderness. MUSCULOSKELETAL: Normal range of motion. No tenderness.  No cyanosis, clubbing, or edema.  2+ distal  pulses. SKIN: Skin is warm and dry. No rash noted. Not diaphoretic. No erythema. No pallor. NEUROLOGIC: Alert and oriented to person, place, and time. Normal reflexes, muscle tone coordination. No cranial nerve deficit noted. PSYCHIATRIC: Normal mood and affect. Normal behavior. Normal judgment and thought content.    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 Supervision of normal first pregnancy, antepartum Initial labs obtained Continue prenatal vitamins Reviewed n/v relief measures and warning s/s to report Reviewed recommended weight gain based on pre-gravid BMI Encouraged well-balanced diet Genetic & carrier screening discussed: requests Panorama, fetal survey: requested CCNC completed> form faxed if has or is planning to apply for medicaid The nature of [facility] [employer] with multiple MDs and other Advanced Practice Providers was explained to patient; also emphasized that fellows, residents, and students are part of our team. Check bp weekly, let us know if >140/90.  - CBC/D/Plt+RPR+Rh+ABO+RubIgG... - Hemoglobpathy+Fer w/A Thal Rfx - Culture, OB Urine - [name] MFM OB COMP + 14 WK; Future - Genetic Screening - Cytology - PAP( )  2. [redacted] weeks gestation of pregnancy  -  CBC/D/Plt+RPR+Rh+ABO+RubIgG... - Hemoglobpathy+Fer w/A Thal Rfx - Culture, OB Urine - [name] MFM OB COMP + 14 WK; Future - Genetic Screening - Cytology - PAP( [place])     Problem list reviewed and updated. 75% of 30 min visit spent on counseling and coordination of care.     Levie Heritage 07/24/2020

## 2020-07-25 ENCOUNTER — Encounter: Payer: Self-pay | Admitting: General Practice

## 2020-07-25 ENCOUNTER — Encounter (HOSPITAL_COMMUNITY): Payer: Self-pay | Admitting: Family Medicine

## 2020-07-25 ENCOUNTER — Inpatient Hospital Stay (HOSPITAL_COMMUNITY)
Admission: AD | Admit: 2020-07-25 | Discharge: 2020-07-25 | Disposition: A | Discharge status: Home / Self Care | Payer: 59 | Attending: Family Medicine | Admitting: Family Medicine

## 2020-07-25 ENCOUNTER — Other Ambulatory Visit: Payer: Self-pay

## 2020-07-25 DIAGNOSIS — Z3A12 12 weeks gestation of pregnancy: Secondary | ICD-10-CM | POA: Diagnosis not present

## 2020-07-25 DIAGNOSIS — O98811 Other maternal infectious and parasitic diseases complicating pregnancy, first trimester: Secondary | ICD-10-CM | POA: Diagnosis not present

## 2020-07-25 DIAGNOSIS — O218 Other vomiting complicating pregnancy: Secondary | ICD-10-CM | POA: Diagnosis not present

## 2020-07-25 DIAGNOSIS — O98511 Other viral diseases complicating pregnancy, first trimester: Secondary | ICD-10-CM | POA: Insufficient documentation

## 2020-07-25 DIAGNOSIS — O219 Vomiting of pregnancy, unspecified: Secondary | ICD-10-CM

## 2020-07-25 DIAGNOSIS — A084 Viral intestinal infection, unspecified: Secondary | ICD-10-CM | POA: Insufficient documentation

## 2020-07-25 DIAGNOSIS — Z87891 Personal history of nicotine dependence: Secondary | ICD-10-CM | POA: Insufficient documentation

## 2020-07-25 DIAGNOSIS — A09 Infectious gastroenteritis and colitis, unspecified: Secondary | ICD-10-CM | POA: Diagnosis not present

## 2020-07-25 DIAGNOSIS — O210 Mild hyperemesis gravidarum: Secondary | ICD-10-CM

## 2020-07-25 LAB — URINALYSIS, ROUTINE W REFLEX MICROSCOPIC
Bilirubin Urine: NEGATIVE
Glucose, UA: NEGATIVE mg/dL
Hgb urine dipstick: NEGATIVE
Ketones, ur: 80 mg/dL — AB
Leukocytes,Ua: NEGATIVE
Nitrite: NEGATIVE
Protein, ur: 30 mg/dL — AB
Specific Gravity, Urine: 1.028 (ref 1.005–1.030)
pH: 5 (ref 5.0–8.0)

## 2020-07-25 LAB — CBC
HCT: 37.3 % (ref 36.0–46.0)
Hemoglobin: 12.6 g/dL (ref 12.0–15.0)
MCH: 29.9 pg (ref 26.0–34.0)
MCHC: 33.8 g/dL (ref 30.0–36.0)
MCV: 88.6 fL (ref 80.0–100.0)
Platelets: 275 10*3/uL (ref 150–400)
RBC: 4.21 MIL/uL (ref 3.87–5.11)
RDW: 12.3 % (ref 11.5–15.5)
WBC: 6.8 10*3/uL (ref 4.0–10.5)
nRBC: 0 % (ref 0.0–0.2)

## 2020-07-25 LAB — COMPREHENSIVE METABOLIC PANEL
ALT: 19 U/L (ref 0–44)
AST: 22 U/L (ref 15–41)
Albumin: 4.1 g/dL (ref 3.5–5.0)
Alkaline Phosphatase: 39 U/L (ref 38–126)
Anion gap: 12 (ref 5–15)
BUN: 9 mg/dL (ref 6–20)
CO2: 21 mmol/L — ABNORMAL LOW (ref 22–32)
Calcium: 9.5 mg/dL (ref 8.9–10.3)
Chloride: 101 mmol/L (ref 98–111)
Creatinine, Ser: 0.55 mg/dL (ref 0.44–1.00)
GFR, Estimated: >60 mL/min (ref >60)
Glucose, Bld: 67 mg/dL — ABNORMAL LOW (ref 70–99)
Potassium: 3.5 mmol/L (ref 3.5–5.1)
Sodium: 134 mmol/L — ABNORMAL LOW (ref 135–145)
Total Bilirubin: 0.8 mg/dL (ref 0.3–1.2)
Total Protein: 7.1 g/dL (ref 6.5–8.1)

## 2020-07-25 LAB — CYTOLOGY - PAP
Chlamydia: NEGATIVE
Comment: NEGATIVE
Comment: NORMAL
Diagnosis: NEGATIVE
Neisseria Gonorrhea: NEGATIVE

## 2020-07-25 MED ORDER — FAMOTIDINE IN NACL 20-0.9 MG/50ML-% IV SOLN
20.0000 mg | Freq: Once | INTRAVENOUS | Status: AC
  Administered 2020-07-25: 20 mg via INTRAVENOUS
  Filled 2020-07-25: qty 50

## 2020-07-25 MED ORDER — PROMETHAZINE HCL 25 MG/ML IJ SOLN
25.0000 mg | Freq: Once | INTRAVENOUS | Status: AC
  Administered 2020-07-25: 25 mg via INTRAVENOUS
  Filled 2020-07-25: qty 1

## 2020-07-25 MED ORDER — PROMETHAZINE HCL 12.5 MG PO TABS: 12.5000 mg | ORAL_TABLET | Freq: Four times a day (QID) | ORAL | 0 refills | Status: DC | PRN

## 2020-07-25 NOTE — MAU Provider Note (Addendum)
History     CSN: [id number]  Arrival date and time: 07/25/20 1814   Event Date/Time   First Provider Initiated Contact with Patient 07/25/20 1859      Chief Complaint  Patient presents with   Nausea   Emesis   HPI Kelly Henry is a 25 y.o. G1P0000 at [redacted]w[redacted]d who presents to MAU with chief complaint of acute onset vomiting. This is a new problem, onset this morning. Patient has a prescription for Phenergan. She took it with Gatorade around 10am today and immediately vomited. Patient tried chicken noodle soup this afternoon and again vomited profusely. She endorses seeing streaks of green bile in her emesis. She also endorses feeling near syncope as her vomiting continued.  She denies abdominal pain, dysuria, constipation, fever or recent illness. She has initiated care with [facility].  OB History     Gravida  1   Para  0   Term  0   Preterm  0   AB  0   Living  0      SAB  0   IAB  0   Ectopic  0   Multiple  0   Live Births              Past Medical History:  Diagnosis Date   Asthma     Past Surgical History:  Procedure Laterality Date   NO PAST SURGERIES      History reviewed. No pertinent family history.  Social History   Tobacco Use   Smoking status: Former    Pack years: 0.00    Types: Cigarettes    Quit date: 03/01/2014    Years since quitting: 6.4   Smokeless tobacco: Never   Tobacco comments:    Mother smokes  Substance Use Topics   Alcohol use: Not Currently    Alcohol/week: 3.0 standard drinks    Types: 3 Standard drinks or equivalent per week   Drug use: Not Currently    Types: Marijuana    Allergies: No Known Allergies  Medications Prior to Admission  Medication Sig Dispense Refill Last Dose   Prenatal Vit-Fe Fumarate-FA (PRENATAL VITAMINS PO) Take by mouth.   07/24/2020   promethazine (PHENERGAN) 25 MG tablet Take 1 tablet (25 mg total) by mouth every 6 (six) hours as needed for nausea or vomiting. 30 tablet 0 07/25/2020    Doxylamine-Pyridoxine 10-10 MG TBEC Take 2 tabs at bedtime. If needed, add another tab in the morning. If needed, add another tab in the afternoon, up to 4 tabs/day. 120 tablet 0     Review of Systems  Gastrointestinal:  Positive for nausea and vomiting.  Neurological:  Positive for dizziness.  All other systems reviewed and are negative. Physical Exam   Blood pressure 119/90, pulse 98, temperature 98.1 F (36.7 C), temperature source Oral, resp. rate 16, height 5\' 2"  (1.575 m), weight 55.6 kg, last menstrual period 04/13/2020, SpO2 100 %.  Physical Exam Vitals and nursing note reviewed. Exam conducted with a chaperone present.  Constitutional:      Appearance: She is ill-appearing.  Cardiovascular:     Rate and Rhythm: Normal rate and regular rhythm.     Pulses: Normal pulses.     Heart sounds: Normal heart sounds.  Pulmonary:     Effort: Pulmonary effort is normal.     Breath sounds: Normal breath sounds.  Abdominal:     General: Abdomen is flat. Bowel sounds are normal.     Tenderness: There is no  abdominal tenderness.  Skin:    Capillary Refill: Capillary refill takes less than 2 seconds.  Neurological:     Mental Status: She is alert and oriented to person, place, and time.  Psychiatric:        Mood and Affect: Mood normal.        Behavior: Behavior normal.        Thought Content: Thought content normal.        Judgment: Judgment normal.    MAU Course  Procedures  --IUP confirmed 06/11/2020  Orders Placed This Encounter  Procedures   Urinalysis, Routine w reflex microscopic Urine, Clean Catch   CBC   Comprehensive metabolic panel   Saline lock IV   Meds ordered this encounter  Medications   promethazine (PHENERGAN) 25 mg in lactated ringers 1,000 mL infusion   famotidine (PEPCID) IVPB 20 mg premix    Patient Vitals for the past 24 hrs:  BP Temp Temp src Pulse Resp SpO2 Height Weight  07/25/20 1836 119/90 98.1 F (36.7 C) Oral 98 16 100 % -- --  07/25/20  1832 -- -- -- -- -- -- 5\' 2"  (1.575 m) 55.6 kg   Report given to , CNM who assumes care of patient at this time.  Sharen Counter, MSN, CNM Certified Nurse Midwife, [facility] for [facility] 07/25/20 8:04 PM   MDM:   Pt improved after IV fluids and Phenergan and tolerated PO food/fluids.  D/C home with Rx for Phenergan, pt instructed to take by mouth when tolerated, but medication can be placed vaginally if not tolerating PO.  Return precautions reviewed.  Assessment and Plan   1. Nausea and vomiting during pregnancy prior to [redacted] weeks gestation   2. Viral gastroenteritis   3. [redacted] weeks gestation of pregnancy      07/27/20, CNM 10:35 PM

## 2020-07-25 NOTE — MAU Note (Signed)
Kelly Henry is a 25 y.o. at [redacted]w[redacted]d here in MAU reporting: constant vomiting since about 0800 today. Reports 5 episodes of vomiting. States she has not been able to keep anything down. Is concerned that baby is not getting the nutrients it needs. Denies pain, bleeding, and discharge. Took promethazine today but was unable to keep it down.  Onset of complaint: today  Pain score: 0/10  Vitals:   07/25/20 1836  BP: 119/90  Pulse: 98  Resp: 16  Temp: 98.1 F (36.7 C)  SpO2: 100%     FHT: 152  Lab orders placed from triage: UA

## 2020-07-26 LAB — CULTURE, OB URINE

## 2020-07-26 LAB — URINE CULTURE, OB REFLEX

## 2020-07-28 ENCOUNTER — Telehealth: Payer: Self-pay

## 2020-07-28 LAB — CBC/D/PLT+RPR+RH+ABO+RUBIGG...
Antibody Screen: NEGATIVE
Basophils Absolute: 0 10*3/uL (ref 0.0–0.2)
Basos: 0 %
EOS (ABSOLUTE): 0.1 10*3/uL (ref 0.0–0.4)
Eos: 2 %
HCV Ab: <0.1 s/co ratio (ref 0.0–0.9)
HIV Screen 4th Generation wRfx: NONREACTIVE
Hematocrit: 39 % (ref 34.0–46.6)
Hemoglobin: 12.8 g/dL (ref 11.1–15.9)
Hepatitis B Surface Ag: NEGATIVE
Immature Grans (Abs): 0 10*3/uL (ref 0.0–0.1)
Immature Granulocytes: 0 %
Lymphocytes Absolute: 1.1 10*3/uL (ref 0.7–3.1)
Lymphs: 15 %
MCH: 29.6 pg (ref 26.6–33.0)
MCHC: 32.8 g/dL (ref 31.5–35.7)
MCV: 90 fL (ref 79–97)
Monocytes Absolute: 0.4 10*3/uL (ref 0.1–0.9)
Monocytes: 5 %
Neutrophils Absolute: 5.8 10*3/uL (ref 1.4–7.0)
Neutrophils: 78 %
Platelets: 276 10*3/uL (ref 150–450)
RBC: 4.32 x10E6/uL (ref 3.77–5.28)
RDW: 12.2 % (ref 11.7–15.4)
RPR Ser Ql: NONREACTIVE
Rh Factor: POSITIVE
Rubella Antibodies, IGG: 4.74 index (ref >0.99)
WBC: 7.5 10*3/uL (ref 3.4–10.8)

## 2020-07-28 LAB — HCV INTERPRETATION

## 2020-07-28 LAB — HEMOGLOBPATHY+FER W/A THAL RFX
Ferritin: 62 ng/mL (ref 15–150)
Hgb A2: 2.7 % (ref 1.8–3.2)
Hgb A: 97.3 % (ref 96.4–98.8)
Hgb F: 0 % (ref 0.0–2.0)
Hgb S: 0 %

## 2020-07-28 NOTE — Telephone Encounter (Signed)
Spoke to [place] at [place]. Verlon Au called to confirm that the pt is having a Horizion test done. Understanding was voiced. Brittony Billick l Magdelyn Roebuck, CMA

## 2020-08-05 ENCOUNTER — Encounter: Payer: Self-pay | Admitting: General Practice

## 2020-08-19 ENCOUNTER — Ambulatory Visit (INDEPENDENT_AMBULATORY_CARE_PROVIDER_SITE_OTHER): Payer: 59 | Admitting: Advanced Practice Midwife

## 2020-08-19 ENCOUNTER — Other Ambulatory Visit: Payer: Self-pay

## 2020-08-19 VITALS — BP 120/65 | HR 85 | Wt 130.0 lb

## 2020-08-19 DIAGNOSIS — Z3A16 16 weeks gestation of pregnancy: Secondary | ICD-10-CM

## 2020-08-19 DIAGNOSIS — R894 Abnormal immunological findings in specimens from other organs, systems and tissues: Secondary | ICD-10-CM

## 2020-08-19 NOTE — Progress Notes (Signed)
   PRENATAL VISIT NOTE  Subjective:  Kelly Henry is a 25 y.o. G1P0000 at [redacted]w[redacted]d being seen today for ongoing prenatal care.  She is currently monitored for the following issues for this low-risk pregnancy and has Asthma, intermittent; Eczema; and Supervision of normal first pregnancy, antepartum on their problem list.  Patient reports no complaints.  Contractions: Not present. Vag. Bleeding: None.  Movement: Present. Denies leaking of fluid.   The following portions of the patient's history were reviewed and updated as appropriate: allergies, current medications, past family history, past medical history, past social history, past surgical history and problem list.   Objective:   Vitals:   08/19/20 0943  BP: 120/65  Pulse: 85  Weight: 130 lb (59 kg)    Fetal Status: Fetal Heart Rate (bpm): 147   Movement: Present     General:  Alert, oriented and cooperative. Patient is in no acute distress.  Skin: Skin is warm and dry. No rash noted.   Cardiovascular: Normal heart rate noted  Respiratory: Normal respiratory effort, no problems with respiration noted  Abdomen: Soft, gravid, appropriate for gestational age.  Pain/Pressure: Absent     Pelvic: Cervical exam deferred        Extremities: Normal range of motion.  Edema: None  Mental Status: Normal mood and affect. Normal behavior. Normal judgment and thought content.   Assessment and Plan:  Pregnancy: G1P0000 at [redacted]w[redacted]d 1. [redacted] weeks gestation of pregnancy     States wants to do waterbirth     Discussed requirements, will discuss further later visit - AFP, Serum, Open Spina Bifida  2. Herpes simplex antibody positive     Reviewing problem list after pt left office,  noted this as problem     Chart reviewed.  Patient is adamant this is wrong. States had several neg tests.  There is a visit on 04/24/14 with genital lesions that were cultured. They grew HSV 1.  Later got a test for antibodies that was neg, thought to maybe be too soon to  convert.  Need to discuss later  Preterm labor symptoms and general obstetric precautions including but not limited to vaginal bleeding, contractions, leaking of fluid and fetal movement were reviewed in detail with the patient. Please refer to After Visit Summary for other counseling recommendations.     Future Appointments  Date Time Provider Department Center  09/08/2020 10:30 AM [facility] US3 [facility]  09/18/2020  8:55 AM Levie Heritage, DO [facility] None  10/17/2020  9:15 AM Adrian Blackwater, Rhona Raider, DO [facility] None    Wynelle Bourgeois, CNM

## 2020-08-21 LAB — AFP, SERUM, OPEN SPINA BIFIDA
AFP MoM: 1.04
AFP Value: 45 ng/mL
Gest. Age on Collection Date: 16.2 weeks
Maternal Age At EDD: 27 yr
OSBR Risk 1 IN: 10000
Test Results:: NEGATIVE
Weight: 130 [lb_av]

## 2020-09-05 ENCOUNTER — Other Ambulatory Visit: Payer: Self-pay

## 2020-09-05 MED ORDER — PREPLUS 27-1 MG PO TABS: 1.0000 | ORAL_TABLET | Freq: Every day | ORAL | 13 refills | Status: DC

## 2020-09-05 MED ORDER — PRENATAL VITAMINS 28-0.8 MG PO TABS: ORAL_TABLET | ORAL | 2 refills | Status: DC

## 2020-09-08 ENCOUNTER — Ambulatory Visit: Payer: 59 | Attending: Family Medicine

## 2020-09-08 ENCOUNTER — Other Ambulatory Visit: Payer: Self-pay | Admitting: *Deleted

## 2020-09-08 ENCOUNTER — Other Ambulatory Visit: Payer: Self-pay

## 2020-09-08 DIAGNOSIS — Z3400 Encounter for supervision of normal first pregnancy, unspecified trimester: Secondary | ICD-10-CM | POA: Diagnosis not present

## 2020-09-08 DIAGNOSIS — Z3A12 12 weeks gestation of pregnancy: Secondary | ICD-10-CM | POA: Diagnosis not present

## 2020-09-08 DIAGNOSIS — Z362 Encounter for other antenatal screening follow-up: Secondary | ICD-10-CM

## 2020-09-18 ENCOUNTER — Other Ambulatory Visit: Payer: Self-pay

## 2020-09-18 ENCOUNTER — Ambulatory Visit (INDEPENDENT_AMBULATORY_CARE_PROVIDER_SITE_OTHER): Payer: 59 | Admitting: Family Medicine

## 2020-09-18 VITALS — BP 124/71 | HR 92 | Wt 139.0 lb

## 2020-09-18 DIAGNOSIS — Z3400 Encounter for supervision of normal first pregnancy, unspecified trimester: Secondary | ICD-10-CM

## 2020-09-18 DIAGNOSIS — Z3A20 20 weeks gestation of pregnancy: Secondary | ICD-10-CM

## 2020-09-18 DIAGNOSIS — R894 Abnormal immunological findings in specimens from other organs, systems and tissues: Secondary | ICD-10-CM

## 2020-09-18 NOTE — Progress Notes (Signed)
   PRENATAL VISIT NOTE  Subjective:  Kelly Henry is a 25 y.o. G1P0000 at [redacted]w[redacted]d being seen today for ongoing prenatal care.  She is currently monitored for the following issues for this low-risk pregnancy and has Asthma, intermittent; Eczema; and Supervision of normal first pregnancy, antepartum on their problem list.  Patient reports no complaints.  Contractions: Not present. Vag. Bleeding: None.  Movement: Present. Denies leaking of fluid.   The following portions of the patient's history were reviewed and updated as appropriate: allergies, current medications, past family history, past medical history, past social history, past surgical history and problem list.   Objective:   Vitals:   09/18/20 0902  BP: 124/71  Pulse: 92  Weight: 139 lb (63 kg)    Fetal Status: Fetal Heart Rate (bpm): 150 Fundal Height: 21 cm Movement: Present     General:  Alert, oriented and cooperative. Patient is in no acute distress.  Skin: Skin is warm and dry. No rash noted.   Cardiovascular: Normal heart rate noted  Respiratory: Normal respiratory effort, no problems with respiration noted  Abdomen: Soft, gravid, appropriate for gestational age.  Pain/Pressure: Absent     Pelvic: Cervical exam deferred        Extremities: Normal range of motion.  Edema: None  Mental Status: Normal mood and affect. Normal behavior. Normal judgment and thought content.   Assessment and Plan:  Pregnancy: G1P0000 at [redacted]w[redacted]d 1. [redacted] weeks gestation of pregnancy  2. Supervision of normal first pregnancy, antepartum FHT and FH normal  3. Herpes simplex antibody positive HSV culture of labial ulcer in 04/2014. Patient previously stated this is not correct. Check HSV serotype with 28 week labs.  Preterm labor symptoms and general obstetric precautions including but not limited to vaginal bleeding, contractions, leaking of fluid and fetal movement were reviewed in detail with the patient. Please refer to After Visit Summary  for other counseling recommendations.   No follow-ups on file.  Future Appointments  Date Time Provider Department Center  10/07/2020 10:00 AM [facility] NURSE [facility]  10/07/2020 10:15 AM [facility]  10/17/2020  9:15 AM Levie Heritage, DO [facility] None    Levie Heritage, DO

## 2020-10-07 ENCOUNTER — Encounter: Payer: Self-pay | Admitting: *Deleted

## 2020-10-07 ENCOUNTER — Other Ambulatory Visit: Payer: Self-pay

## 2020-10-07 ENCOUNTER — Ambulatory Visit: Payer: 59 | Admitting: *Deleted

## 2020-10-07 ENCOUNTER — Ambulatory Visit: Payer: 59 | Attending: Obstetrics

## 2020-10-07 VITALS — BP 122/60 | HR 75

## 2020-10-07 DIAGNOSIS — O358XX0 Maternal care for other (suspected) fetal abnormality and damage, not applicable or unspecified: Secondary | ICD-10-CM | POA: Diagnosis not present

## 2020-10-07 DIAGNOSIS — Z3400 Encounter for supervision of normal first pregnancy, unspecified trimester: Secondary | ICD-10-CM | POA: Insufficient documentation

## 2020-10-07 DIAGNOSIS — Z362 Encounter for other antenatal screening follow-up: Secondary | ICD-10-CM | POA: Insufficient documentation

## 2020-10-07 DIAGNOSIS — Z3A23 23 weeks gestation of pregnancy: Secondary | ICD-10-CM | POA: Diagnosis not present

## 2020-10-14 ENCOUNTER — Encounter: Payer: Self-pay | Admitting: General Practice

## 2020-10-14 ENCOUNTER — Encounter: Payer: Self-pay | Admitting: Advanced Practice Midwife

## 2020-10-14 ENCOUNTER — Ambulatory Visit (INDEPENDENT_AMBULATORY_CARE_PROVIDER_SITE_OTHER): Payer: 59 | Admitting: Advanced Practice Midwife

## 2020-10-14 ENCOUNTER — Other Ambulatory Visit: Payer: Self-pay

## 2020-10-14 VITALS — BP 125/68 | HR 96 | Wt 148.0 lb

## 2020-10-14 DIAGNOSIS — Z3400 Encounter for supervision of normal first pregnancy, unspecified trimester: Secondary | ICD-10-CM

## 2020-10-14 DIAGNOSIS — Z3A24 24 weeks gestation of pregnancy: Secondary | ICD-10-CM

## 2020-10-14 NOTE — Progress Notes (Signed)
   PRENATAL VISIT NOTE  Subjective:  Kelly Henry is a 25 y.o. G1P0000 at [redacted]w[redacted]d being seen today for ongoing prenatal care.  She is currently monitored for the following issues for this low-risk pregnancy and has Asthma, intermittent; Eczema; and Supervision of normal first pregnancy, antepartum on their problem list.  Patient reports no complaints.  Contractions: Not present. Vag. Bleeding: None.  Movement: Present. Denies leaking of fluid.   The following portions of the patient's history were reviewed and updated as appropriate: allergies, current medications, past family history, past medical history, past social history, past surgical history and problem list.   Objective:   Vitals:   10/14/20 1041  BP: 125/68  Pulse: 96  Weight: 148 lb (67.1 kg)    Fetal Status: Fetal Heart Rate (bpm): 154   Movement: Present     General:  Alert, oriented and cooperative. Patient is in no acute distress.  Skin: Skin is warm and dry. No rash noted.   Cardiovascular: Normal heart rate noted  Respiratory: Normal respiratory effort, no problems with respiration noted  Abdomen: Soft, gravid, appropriate for gestational age.  Pain/Pressure: Absent     Pelvic: Cervical exam deferred        Extremities: Normal range of motion.  Edema: None  Mental Status: Normal mood and affect. Normal behavior. Normal judgment and thought content.   Assessment and Plan:  Pregnancy: G1P0000 at [redacted]w[redacted]d 1. [redacted] weeks gestation of pregnancy     Waterbirth discussed in detail     Has gone to class and needs to bring certificate     Consent signed  2. Supervision of normal first pregnancy, antepartum      Glucola next visit      Re-order HSV glycoproteins then  Preterm labor symptoms and general obstetric precautions including but not limited to vaginal bleeding, contractions, leaking of fluid and fetal movement were reviewed in detail with the patient. Please refer to After Visit Summary for other counseling  recommendations.   Return in about 4 weeks (around 11/11/2020), or Glucola next visit, for [facility].  Future Appointments  Date Time Provider Department Center  11/11/2020  9:15 AM Aviva Signs, CNM [facility] None  11/28/2020 10:55 AM Bernerd Limbo, CNM [facility] None    Wynelle Bourgeois, CNM

## 2020-10-17 ENCOUNTER — Encounter: Payer: 59 | Admitting: Family Medicine

## 2020-11-11 ENCOUNTER — Other Ambulatory Visit: Payer: Self-pay

## 2020-11-11 ENCOUNTER — Ambulatory Visit (INDEPENDENT_AMBULATORY_CARE_PROVIDER_SITE_OTHER): Payer: 59 | Admitting: Advanced Practice Midwife

## 2020-11-11 VITALS — BP 114/63 | HR 88 | Wt 159.0 lb

## 2020-11-11 DIAGNOSIS — Z3400 Encounter for supervision of normal first pregnancy, unspecified trimester: Secondary | ICD-10-CM

## 2020-11-11 DIAGNOSIS — Z3A28 28 weeks gestation of pregnancy: Secondary | ICD-10-CM

## 2020-11-11 DIAGNOSIS — Z23 Encounter for immunization: Secondary | ICD-10-CM | POA: Diagnosis not present

## 2020-11-11 DIAGNOSIS — Z3402 Encounter for supervision of normal first pregnancy, second trimester: Secondary | ICD-10-CM

## 2020-11-11 NOTE — Progress Notes (Signed)
   PRENATAL VISIT NOTE  Subjective:  Kelly Henry is a 25 y.o. G1P0000 at [redacted]w[redacted]d being seen today for ongoing prenatal care.  She is currently monitored for the following issues for this low-risk pregnancy and has Asthma, intermittent; Eczema; and Supervision of normal first pregnancy, antepartum on their problem list.  Patient reports no bleeding and no cramping.  Contractions: Not present. Vag. Bleeding: None.  Movement: Present. Denies leaking of fluid.   The following portions of the patient's history were reviewed and updated as appropriate: allergies, current medications, past family history, past medical history, past social history, past surgical history and problem list.   Objective:   Vitals:   11/11/20 0906  BP: 114/63  Pulse: 88  Weight: 159 lb (72.1 kg)    Fetal Status: Fetal Heart Rate (bpm): 145   Movement: Present     General:  Alert, oriented and cooperative. Patient is in no acute distress.  Skin: Skin is warm and dry. No rash noted.   Cardiovascular: Normal heart rate noted  Respiratory: Normal respiratory effort, no problems with respiration noted  Abdomen: Soft, gravid, appropriate for gestational age.  Pain/Pressure: Absent     Pelvic: Cervical exam deferred        Extremities: Normal range of motion.  Edema: None  Mental Status: Normal mood and affect. Normal behavior. Normal judgment and thought content.   Assessment and Plan:  Pregnancy: G1P0000 at [redacted]w[redacted]d 1. [redacted] weeks gestation of pregnancy  - Glucose Tolerance, 2 Hours w/1 Hour - HIV antibody (with reflex) - [facility] - CBC  2. Supervision of normal first pregnancy, antepartum  - Glucose Tolerance, 2 Hours w/1 Hour - HIV antibody (with reflex) - [facility] - CBC  Preterm labor symptoms and general obstetric precautions including but not limited to vaginal bleeding, contractions, leaking of fluid and fetal movement were reviewed in detail with the patient. Please refer to After Visit Summary for other  counseling recommendations.   Future Appointments  Date Time Provider Department Center  11/28/2020 10:55 AM Bernerd Limbo, CNM [facility] None    Wynelle Bourgeois, CNM

## 2020-11-12 LAB — CBC
Hematocrit: 33.4 % — ABNORMAL LOW (ref 34.0–46.6)
Hemoglobin: 10.9 g/dL — ABNORMAL LOW (ref 11.1–15.9)
MCH: 29.5 pg (ref 26.6–33.0)
MCHC: 32.6 g/dL (ref 31.5–35.7)
MCV: 90 fL (ref 79–97)
Platelets: 220 10*3/uL (ref 150–450)
RBC: 3.7 x10E6/uL — ABNORMAL LOW (ref 3.77–5.28)
RDW: 12.2 % (ref 11.7–15.4)
WBC: 7.5 10*3/uL (ref 3.4–10.8)

## 2020-11-12 LAB — GLUCOSE TOLERANCE, 2 HOURS W/ 1HR
Glucose, 1 hour: 143 mg/dL (ref 65–179)
Glucose, 2 hour: 114 mg/dL (ref 65–152)
Glucose, Fasting: 74 mg/dL (ref 65–91)

## 2020-11-12 LAB — RPR: RPR Ser Ql: NONREACTIVE

## 2020-11-12 LAB — HIV ANTIBODY (ROUTINE TESTING W REFLEX): HIV Screen 4th Generation wRfx: NONREACTIVE

## 2020-11-25 ENCOUNTER — Encounter: Payer: 59 | Admitting: Advanced Practice Midwife

## 2020-11-28 ENCOUNTER — Other Ambulatory Visit: Payer: Self-pay

## 2020-11-28 ENCOUNTER — Ambulatory Visit (INDEPENDENT_AMBULATORY_CARE_PROVIDER_SITE_OTHER): Payer: 59 | Admitting: Certified Nurse Midwife

## 2020-11-28 VITALS — BP 121/77 | HR 112 | Temp 97.7°F | Wt 163.4 lb

## 2020-11-28 DIAGNOSIS — Z3A30 30 weeks gestation of pregnancy: Secondary | ICD-10-CM

## 2020-11-28 DIAGNOSIS — Z3493 Encounter for supervision of normal pregnancy, unspecified, third trimester: Secondary | ICD-10-CM

## 2020-11-28 DIAGNOSIS — Z8619 Personal history of other infectious and parasitic diseases: Secondary | ICD-10-CM

## 2020-11-28 MED ORDER — PREPLUS 27-1 MG PO TABS: 1.0000 | ORAL_TABLET | Freq: Every day | ORAL | 13 refills | Status: DC

## 2020-11-28 NOTE — Progress Notes (Signed)
   PRENATAL VISIT NOTE  Subjective:  Kelly Henry is a 25 y.o. G1P0000 at [redacted]w[redacted]d being seen today for ongoing prenatal care.  She is currently monitored for the following issues for this low-risk pregnancy and has Asthma, intermittent; Eczema; and Supervision of normal first pregnancy, antepartum on their problem list.  Patient reports no complaints, switched to [facility] from [facility] for better availability of midwives.  Contractions: Not present. Vag. Bleeding: None.  Movement: Present. Denies leaking of fluid.   The following portions of the patient's history were reviewed and updated as appropriate: allergies, current medications, past family history, past medical history, past social history, past surgical history and problem list.   Objective:   Vitals:   11/28/20 1107  BP: 121/77  Pulse: (!) 112  Temp: 97.7 F (36.5 C)  Weight: 163 lb 6.4 oz (74.1 kg)    Fetal Status: Fetal Heart Rate (bpm): 148 Fundal Height: 31 cm Movement: Present     General:  Alert, oriented and cooperative. Patient is in no acute distress.  Skin: Skin is warm and dry. No rash noted.   Cardiovascular: Normal heart rate noted  Respiratory: Normal respiratory effort, no problems with respiration noted  Abdomen: Soft, gravid, appropriate for gestational age.  Pain/Pressure: Absent     Pelvic: Cervical exam deferred        Extremities: Normal range of motion.  Edema: None  Mental Status: Normal mood and affect. Normal behavior. Normal judgment and thought content.   Assessment and Plan:  Pregnancy: G1P0000 at [redacted]w[redacted]d 1. Supervision of low-risk pregnancy, third trimester - Doing well, feeling regular and vigorous fetal movement  - Welcomed to [facility] - Prenatal Vit-Fe Fumarate-FA (PREPLUS) 27-1 MG TABS; Take 1 tablet by mouth daily.  Dispense: 30 tablet; Refill: 13  2. [redacted] weeks gestation of pregnancy - Routine OB care - Reviewed 3rd trimester labs  3. History of herpes simplex infection -  Reviewed lab work/culture from 2016 with patient. Explained that clinician assessment and culture is more reliable than blood testing, emphasizing that her blood testing at the time of initial infection would be expected to be negative. Offered blood testing today, but also emphasized reliability of culture/clinician assessment. - Reassurance given, also advised she start on Valtrex at 36wks as a precaution to avoid any breakout close to her delivery date since she so strongly desires waterbirth. Pt agreed to plan and understanding of diagnosis.  Preterm labor symptoms and general obstetric precautions including but not limited to vaginal bleeding, contractions, leaking of fluid and fetal movement were reviewed in detail with the patient. Please refer to After Visit Summary for other counseling recommendations.   Return in about 2 weeks (around 12/12/2020) for IN-PERSON, LOB.  Future Appointments  Date Time Provider Department Center  12/17/2020  8:35 AM Bernerd Limbo, CNM [facility] None  12/26/2020 10:35 AM Gerrit Heck, CNM [facility] None  01/09/2021  8:55 AM Gerrit Heck, CNM [facility] None  01/16/2021  8:55 AM Gerrit Heck, CNM [facility] None  01/23/2021  8:55 AM Raelyn Mora, CNM [facility] None  01/30/2021  8:55 AM Bernerd Limbo, CNM [facility] None    Bernerd Limbo, CNM

## 2020-11-28 NOTE — Patient Instructions (Signed)
AREA PEDIATRIC/FAMILY PRACTICE PHYSICIANS  [facility] [address] [place], [place] [address] Phone - [phone]   Fax - [phone]  [facility] [address] [place], [place]  [address] Phone - [phone]   Fax - [phone]  [facility] [address] [place], [place]  [address] Phone - [phone]   Fax - [phone]  [facility] [address] [place], [place]  [address] Phone - [phone]   Fax - [phone]  [facility] [address] [place], [place]  [address] Phone - [phone]   Fax - [phone]  [facility] [address] [place], [place]  [address] Phone - [phone]   Fax - [phone]  [facility] [address] [place], [place]  [address] Phone - [phone]   Fax - [phone]  [facility] [address] [place], [place]  [address] Phone - [phone]   Fax - [phone]  [facility] [address] [place], [place]  [address] Phone - [phone]   Fax - [phone] [facility] [address] [place], [place]  [address] Phone - [phone]   Fax - [phone]  [facility] [address] [place], [place]  [address] Phone - [phone]   Fax - [phone]  [facility] [address] [place], [place]  [address] Phone - [phone]   Fax - [phone]  [facility] [address], [address] [place], [place]  [address] Phone - [phone]   Fax - [phone]  [facility] [address] [place], [place]  [address] Phone - [phone]  [facility] [address] [place], [place]  [address] Phone - [phone]   Fax - [phone]  [facility] [address] [place], [place]  [address] Phone - [phone]   Fax - [phone]  [facility] [address] [place], [place]  [address] Phone - [phone]   Fax - [phone]  [facility] [address] [place], [place]  [address] Phone - [phone]   Fax - [phone]   [facility] [address] [place], [place]  [address] Phone - [phone]   Fax - [phone]  [facility] [address] [place], [place]  [address] Phone - [phone]   Fax - [phone]  [facility] [address] , [place]  [address] Phone - [phone]   Fax - [phone]  [facility] [address] , [place]  [address] Phone - [phone]   Fax - [phone]  [facility] [address] , [place]  [address] Phone - [phone]   Fax - [phone] [facility] [address] [place], [place]  [address] Phone - [phone]   Fax - [phone]  [facility] [address] [place], [place]  [address] Phone - [phone]   Fax - [phone]   [facility] [address] [place],   [address] Phone - [phone]   Fax - [phone]   

## 2020-12-12 ENCOUNTER — Encounter: Payer: 59 | Admitting: Obstetrics and Gynecology

## 2020-12-17 ENCOUNTER — Ambulatory Visit (INDEPENDENT_AMBULATORY_CARE_PROVIDER_SITE_OTHER): Payer: 59 | Admitting: Certified Nurse Midwife

## 2020-12-17 ENCOUNTER — Other Ambulatory Visit: Payer: Self-pay

## 2020-12-17 VITALS — BP 125/80 | HR 114 | Temp 97.8°F | Wt 166.2 lb

## 2020-12-17 DIAGNOSIS — Z3A33 33 weeks gestation of pregnancy: Secondary | ICD-10-CM

## 2020-12-17 DIAGNOSIS — Z3493 Encounter for supervision of normal pregnancy, unspecified, third trimester: Secondary | ICD-10-CM

## 2020-12-17 NOTE — Progress Notes (Signed)
   PRENATAL VISIT NOTE  Subjective:  Kelly Henry is a 25 y.o. G1P0000 at [redacted]w[redacted]d being seen today for ongoing prenatal care.  She is currently monitored for the following issues for this low-risk pregnancy and has Asthma, intermittent; Eczema; and Supervision of normal first pregnancy, antepartum on their problem list.  Patient reports no complaints.  Contractions: Not present. Vag. Bleeding: None.  Movement: Present. Denies leaking of fluid.   The following portions of the patient's history were reviewed and updated as appropriate: allergies, current medications, past family history, past medical history, past social history, past surgical history and problem list.   Objective:   Vitals:   12/17/20 0842  BP: 125/80  Pulse: (!) 114  Temp: 97.8 F (36.6 C)  Weight: 166 lb 3.2 oz (75.4 kg)    Fetal Status:     Movement: Present     General:  Alert, oriented and cooperative. Patient is in no acute distress.  Skin: Skin is warm and dry. No rash noted.   Cardiovascular: Normal heart rate noted  Respiratory: Normal respiratory effort, no problems with respiration noted  Abdomen: Soft, gravid, appropriate for gestational age.  Pain/Pressure: Present     Pelvic: Cervical exam deferred        Extremities: Normal range of motion.  Edema: None  Mental Status: Normal mood and affect. Normal behavior. Normal judgment and thought content.   Assessment and Plan:  Pregnancy: G1P0000 at [redacted]w[redacted]d 1. Supervision of low-risk pregnancy, third trimester - Doing well, feeling regular and vigorous fetal movement   2. [redacted] weeks gestation of pregnancy - Routine OB care   Preterm labor symptoms and general obstetric precautions including but not limited to vaginal bleeding, contractions, leaking of fluid and fetal movement were reviewed in detail with the patient. Please refer to After Visit Summary for other counseling recommendations.   Return in about 2 weeks (around 12/31/2020) for IN-PERSON,  LOB.  Future Appointments  Date Time Provider Department Center  12/26/2020 10:35 AM Gerrit Heck, CNM [facility] None  01/09/2021  8:55 AM Gerrit Heck, CNM [facility] None  01/16/2021  8:55 AM Gerrit Heck, CNM [facility] None  01/23/2021  8:55 AM Raelyn Mora, CNM [facility] None  01/30/2021  8:55 AM Bernerd Limbo, CNM [facility] None    Bernerd Limbo, CNM

## 2020-12-26 ENCOUNTER — Other Ambulatory Visit: Payer: Self-pay

## 2020-12-26 ENCOUNTER — Ambulatory Visit (INDEPENDENT_AMBULATORY_CARE_PROVIDER_SITE_OTHER): Payer: 59

## 2020-12-26 VITALS — BP 127/80 | HR 102 | Temp 97.8°F | Wt 168.8 lb

## 2020-12-26 DIAGNOSIS — Z3493 Encounter for supervision of normal pregnancy, unspecified, third trimester: Secondary | ICD-10-CM

## 2020-12-26 DIAGNOSIS — Z3A34 34 weeks gestation of pregnancy: Secondary | ICD-10-CM

## 2020-12-26 DIAGNOSIS — Z8619 Personal history of other infectious and parasitic diseases: Secondary | ICD-10-CM

## 2020-12-26 MED ORDER — VALACYCLOVIR HCL 500 MG PO TABS: 500.0000 mg | ORAL_TABLET | Freq: Two times a day (BID) | ORAL | 6 refills | Status: DC

## 2020-12-26 NOTE — Progress Notes (Signed)
   LOW-RISK PREGNANCY OFFICE VISIT  Patient name: Kelly Henry MRN [id number]  Date of birth: 1995-07-01 Chief Complaint:   Routine Prenatal Visit  Subjective:   Kelly Henry is a 25 y.o. G68P0000 female at [redacted]w[redacted]d with an Estimated Date of Delivery: 02/01/21 being seen today for ongoing management of a low-risk pregnancy aeb has Asthma, intermittent; Eczema; and Supervision of normal first pregnancy, antepartum on their problem list.  Patient presents today with  pelvic pain .  Patient endorses fetal movement. Patient denies abdominal cramping or contractions.  Patient denies vaginal concerns including abnormal discharge, leaking of fluid, and bleeding.  Contractions: Not present. Vag. Bleeding: None.  Movement: Present.  Reviewed past medical,surgical, social, obstetrical and family history as well as problem list, medications and allergies.  Objective   Vitals:   12/26/20 1040  BP: 127/80  Pulse: (!) 102  Temp: 97.8 F (36.6 C)  Weight: 168 lb 12.8 oz (76.6 kg)  Body mass index is 30.87 kg/m.  Total Weight Gain:43 lb 12.8 oz (19.9 kg)         Physical Examination:   General appearance: Well appearing, and in no distress  Mental status: Alert, oriented to person, place, and time  Skin: Warm & dry  Cardiovascular: Normal heart rate noted  Respiratory: Normal respiratory effort, no distress  Abdomen: Soft, gravid, nontender, AGA with    Pelvic: Cervical exam deferred           Extremities: Edema: None  Fetal Status: Fetal Heart Rate (bpm): 133  Movement: Present   No results found for this or any previous visit (from the past 24 hour(s)).  Assessment & Plan:  Low-risk pregnancy of a 26 y.o., G1P0000 at [redacted]w[redacted]d with an Estimated Date of Delivery: 02/01/21   1. Supervision of low-risk pregnancy, third trimester -Anticipatory guidance for upcoming appts. -Patient to schedule next appt in 2 weeks for an in-person visit.  -Educated on GBS bacteria including what it is, why  we test, and how and when we treat if needed. -Discussed releasing of results to mychart. -Given informational sheet in avs.  2. [redacted] weeks gestation of pregnancy -Reviewed c/o pelvic pain. -Reassured that normal and symptom of fetal position in pelvis. -Discussed relief measures, but cautioned that symptoms will progress until delivery.   3. History of herpes simplex infection -Rx for suppression sent to pharmacy on file.  -Patient instructed to start before next appt.     Meds: No orders of the defined types were placed in this encounter.  Labs/procedures today:  Lab Orders  No laboratory test(s) ordered today     Reviewed: Preterm labor symptoms and general obstetric precautions including but not limited to vaginal bleeding, contractions, leaking of fluid and fetal movement were reviewed in detail with the patient.  All questions were answered.  Follow-up: No follow-ups on file.  No orders of the defined types were placed in this encounter.  Cherre Robins MSN, CNM 12/26/2020

## 2021-01-09 ENCOUNTER — Other Ambulatory Visit: Payer: Self-pay

## 2021-01-09 ENCOUNTER — Other Ambulatory Visit (HOSPITAL_COMMUNITY)
Admission: RE | Admit: 2021-01-09 | Discharge: 2021-01-09 | Disposition: A | Discharge status: Home / Self Care | Payer: 59 | Source: Ambulatory Visit

## 2021-01-09 ENCOUNTER — Ambulatory Visit (INDEPENDENT_AMBULATORY_CARE_PROVIDER_SITE_OTHER): Payer: 59

## 2021-01-09 VITALS — BP 127/75 | HR 98 | Temp 97.6°F | Wt 174.8 lb

## 2021-01-09 DIAGNOSIS — Z3A36 36 weeks gestation of pregnancy: Secondary | ICD-10-CM

## 2021-01-09 DIAGNOSIS — Z3400 Encounter for supervision of normal first pregnancy, unspecified trimester: Secondary | ICD-10-CM | POA: Insufficient documentation

## 2021-01-09 DIAGNOSIS — Z8619 Personal history of other infectious and parasitic diseases: Secondary | ICD-10-CM

## 2021-01-09 NOTE — Progress Notes (Signed)
   LOW-RISK PREGNANCY OFFICE VISIT  Patient name: RYELEE Henry MRN [id number]  Date of birth: 1995/03/10 Chief Complaint:   Routine Prenatal Visit  Subjective:   Kelly Henry is a 25 y.o. G35P0000 female at [redacted]w[redacted]d with an Estimated Date of Delivery: 02/01/21 being seen today for ongoing management of a low-risk pregnancy aeb has Asthma, intermittent; Eczema; and Supervision of normal first pregnancy, antepartum on their problem list.  Patient presents today with  pelvic pressure .  Patient endorses fetal movement. Patient denies abdominal cramping or contractions.  Patient denies vaginal concerns including abnormal discharge, leaking of fluid, and bleeding.  Contractions: Not present. Vag. Bleeding: None.  Movement: Present.  Reviewed past medical,surgical, social, obstetrical and family history as well as problem list, medications and allergies.  Objective   Vitals:   01/09/21 0914  BP: 127/75  Pulse: 98  Temp: 97.6 F (36.4 C)  Weight: 174 lb 12.8 oz (79.3 kg)  Body mass index is 31.97 kg/m.  Total Weight Gain:49 lb 12.8 oz (22.6 kg)         Physical Examination:   General appearance: Well appearing, and in no distress  Mental status: Alert, oriented to person, place, and time  Skin: Warm & dry  Cardiovascular: Normal heart rate noted  Respiratory: Normal respiratory effort, no distress  Abdomen: Soft, gravid, nontender, AGA with Fundal Height: 37 cm  Pelvic: Cervical exam performed  Dilation: Closed Effacement (%): Thick Station: Ballotable Presentation: Undeterminable  Extremities: Edema: None  Fetal Status: Fetal Heart Rate (bpm): 146  Movement: Present   No results found for this or any previous visit (from the past 24 hour(s)).  Assessment & Plan:  Low-risk pregnancy of a 25 y.o., G1P0000 at [redacted]w[redacted]d with an Estimated Date of Delivery: 02/01/21   1. Supervision of normal first pregnancy, antepartum -Anticipatory guidance for upcoming appts. -Patient to schedule  next appt in 1 weeks for an in-person visit. -Educated on GBS bacteria including what it is, why we test, and how and when we treat if needed. -GBS collected without issues. -Results to mychart and/or at next appt.  2. [redacted] weeks gestation of pregnancy -Doing well -Reviewed TWG of 49 lbs. -Discussed cervical exam findings.  -Reassured of pelvic pain.  3. History of herpes simplex infection -Taking suppressive therapy   Meds: No orders of the defined types were placed in this encounter.  Labs/procedures today: Lab Orders         Culture, beta strep (group b only)      Reviewed: Preterm labor symptoms and general obstetric precautions including but not limited to vaginal bleeding, contractions, leaking of fluid and fetal movement were reviewed in detail with the patient.  All questions were answered.  Follow-up: Return in about 1 week (around 01/16/2021) for LROB.  Orders Placed This Encounter  Procedures   Culture, beta strep (group b only)   Cherre Robins MSN, CNM 01/09/2021

## 2021-01-12 LAB — CERVICOVAGINAL ANCILLARY ONLY
Chlamydia: NEGATIVE
Comment: NEGATIVE
Comment: NEGATIVE
Comment: NORMAL
Neisseria Gonorrhea: NEGATIVE
Trichomonas: NEGATIVE

## 2021-01-12 LAB — CULTURE, BETA STREP (GROUP B ONLY): Strep Gp B Culture: POSITIVE — AB

## 2021-01-15 DIAGNOSIS — O99820 Streptococcus B carrier state complicating pregnancy: Secondary | ICD-10-CM | POA: Insufficient documentation

## 2021-01-15 DIAGNOSIS — Z22330 Carrier of Group B streptococcus: Secondary | ICD-10-CM | POA: Insufficient documentation

## 2021-01-16 ENCOUNTER — Ambulatory Visit (INDEPENDENT_AMBULATORY_CARE_PROVIDER_SITE_OTHER): Payer: 59

## 2021-01-16 ENCOUNTER — Other Ambulatory Visit: Payer: Self-pay

## 2021-01-16 VITALS — BP 118/73 | HR 149 | Temp 98.6°F | Wt 177.0 lb

## 2021-01-16 DIAGNOSIS — O99891 Other specified diseases and conditions complicating pregnancy: Secondary | ICD-10-CM

## 2021-01-16 DIAGNOSIS — Z3A37 37 weeks gestation of pregnancy: Secondary | ICD-10-CM

## 2021-01-16 DIAGNOSIS — Z3400 Encounter for supervision of normal first pregnancy, unspecified trimester: Secondary | ICD-10-CM

## 2021-01-16 DIAGNOSIS — M549 Dorsalgia, unspecified: Secondary | ICD-10-CM

## 2021-01-16 MED ORDER — CYCLOBENZAPRINE HCL 5 MG PO TABS: 5.0000 mg | ORAL_TABLET | Freq: Every evening | ORAL | 0 refills | Status: DC | PRN

## 2021-01-16 NOTE — Progress Notes (Signed)
   LOW-RISK PREGNANCY OFFICE VISIT  Patient name: Kelly Henry MRN [id number]  Date of birth: 01-05-1996 Chief Complaint:   Routine Prenatal Visit  Subjective:   Kelly Henry is a 25 y.o. G46P0000 female at [redacted]w[redacted]d with an Estimated Date of Delivery: 02/01/21 being seen today for ongoing management of a low-risk pregnancy aeb has Asthma, intermittent; Eczema; Supervision of normal first pregnancy, antepartum; and GBS (group B Streptococcus carrier), +RV culture, currently pregnant on their problem list.  Patient presents today with backache and fatigue.  She states she has been experiencing intermittent cramping and constant lower back pain. Patient endorses fetal movement. Patient denies contractions.  Patient denies vaginal concerns including abnormal discharge, leaking of fluid, and bleeding.  Contractions: Irregular. Vag. Bleeding: None.  Movement: Present.  Reviewed past medical,surgical, social, obstetrical and family history as well as problem list, medications and allergies.  Objective   Vitals:   01/16/21 0915  BP: 118/73  Pulse: (!) 149  Temp: 98.6 F (37 C)  Weight: 177 lb (80.3 kg)  There is no height or weight on file to calculate BMI.  Total Weight Gain:49 lb 12.8 oz (22.6 kg)         Physical Examination:   General appearance: Well appearing, and in no distress  Mental status: Alert, oriented to person, place, and time  Skin: Warm & dry  Cardiovascular: Normal heart rate noted  Respiratory: Normal respiratory effort, no distress  Abdomen: Soft, gravid, nontender, AGA with Fundal Height: 38 cm  Pelvic: Cervical exam deferred           Extremities:    Fetal Status: Fetal Heart Rate (bpm): 153  Movement: Present   No results found for this or any previous visit (from the past 24 hour(s)).  Assessment & Plan:  Low-risk pregnancy of a 25 y.o., G1P0000 at [redacted]w[redacted]d with an Estimated Date of Delivery: 02/01/21   1. Supervision of normal first pregnancy,  antepartum -Anticipatory guidance for upcoming appts. -Patient to schedule next appt in 1 weeks for an in-person visit.  2. [redacted] weeks gestation of pregnancy -Discussed complaints. -Reviewed option and availability of virtual visits if needed when experiencing fatigue. -Encouraged rest.  -Labor precautions reviewed.   3. Back pain affecting pregnancy in third trimester -Discussed relief measures for back pain -Limited rx for flexeril sent to pharmacy on file.  -Reviewed usage with tylenol as needed.    Meds: No orders of the defined types were placed in this encounter.  Labs/procedures today:  Lab Orders  No laboratory test(s) ordered today     Reviewed: Term labor symptoms and general obstetric precautions including but not limited to vaginal bleeding, contractions, leaking of fluid and fetal movement were reviewed in detail with the patient.  All questions were answered.  Follow-up: No follow-ups on file.  No orders of the defined types were placed in this encounter.  Cherre Robins MSN, CNM 01/16/2021

## 2021-01-23 ENCOUNTER — Other Ambulatory Visit: Payer: Self-pay

## 2021-01-23 ENCOUNTER — Encounter: Payer: Self-pay | Admitting: Obstetrics and Gynecology

## 2021-01-23 ENCOUNTER — Ambulatory Visit (INDEPENDENT_AMBULATORY_CARE_PROVIDER_SITE_OTHER): Payer: 59 | Admitting: Obstetrics and Gynecology

## 2021-01-23 VITALS — BP 125/85 | HR 90 | Temp 97.5°F | Wt 178.6 lb

## 2021-01-23 DIAGNOSIS — Z8619 Personal history of other infectious and parasitic diseases: Secondary | ICD-10-CM

## 2021-01-23 DIAGNOSIS — Z3400 Encounter for supervision of normal first pregnancy, unspecified trimester: Secondary | ICD-10-CM

## 2021-01-23 DIAGNOSIS — Z3A38 38 weeks gestation of pregnancy: Secondary | ICD-10-CM

## 2021-01-23 NOTE — Progress Notes (Signed)
° °  LOW-RISK PREGNANCY OFFICE VISIT Patient name: Kelly Henry MRN [id number]  Date of birth: Oct 29, 1995 Chief Complaint:   Routine Prenatal Visit  History of Present Illness:   Kelly Henry is a 25 y.o. G103P0000 female at [redacted]w[redacted]d with an Estimated Date of Delivery: 02/01/21 being seen today for ongoing management of a low-risk pregnancy.  Today she reports occasional contractions. Contractions: Irregular. Vag. Bleeding: None.  Movement: Present. denies leaking of fluid. Review of Systems:   Pertinent items are noted in HPI Denies abnormal vaginal discharge w/ itching/odor/irritation, headaches, visual changes, shortness of breath, chest pain, abdominal pain, severe nausea/vomiting, or problems with urination or bowel movements unless otherwise stated above. Pertinent History Reviewed:  Reviewed past medical,surgical, social, obstetrical and family history.  Reviewed problem list, medications and allergies. Physical Assessment:   Vitals:   01/23/21 0903  BP: 125/85  Pulse: 90  Temp: (!) 97.5 F (36.4 C)  Weight: 178 lb 9.6 oz (81 kg)  Body mass index is 32.67 kg/m.        Physical Examination:   General appearance: Well appearing, and in no distress  Mental status: Alert, oriented to person, place, and time  Skin: Warm & dry  Cardiovascular: Normal heart rate noted  Respiratory: Normal respiratory effort, no distress  Abdomen: Soft, gravid, nontender  Pelvic: Cervical exam deferred         Extremities: Edema: None  Fetal Status: Fetal Heart Rate (bpm): 150 Fundal Height: 40 cm Movement: Present    No results found for this or any previous visit (from the past 24 hour(s)).  Assessment & Plan:  1) Low-risk pregnancy G1P0000 at [redacted]w[redacted]d with an Estimated Date of Delivery: 02/01/21   2) Supervision of normal first pregnancy, antepartum - Await labor - Reviewed waterbirth questions  3) History of herpes simplex infection - Taking Valtrex - No reported outbreaks  4) [redacted]  weeks gestation of pregnancy    Meds: No orders of the defined types were placed in this encounter.  Labs/procedures today: none  Plan:  Continue routine obstetrical care   Reviewed: Term labor symptoms and general obstetric precautions including but not limited to vaginal bleeding, contractions, leaking of fluid and fetal movement were reviewed in detail with the patient.  All questions were answered. Has home bp cuff. Check bp weekly, let us know if >140/90.   Follow-up: Return in about 1 week (around 01/30/2021) for Return OB visit.  No orders of the defined types were placed in this encounter.  Raelyn Mora MSN, CNM 01/23/2021 11:33 AM

## 2021-01-30 ENCOUNTER — Other Ambulatory Visit: Payer: Self-pay

## 2021-01-30 ENCOUNTER — Ambulatory Visit (INDEPENDENT_AMBULATORY_CARE_PROVIDER_SITE_OTHER): Payer: 59 | Admitting: Certified Nurse Midwife

## 2021-01-30 VITALS — BP 126/81 | HR 79 | Temp 97.3°F | Wt 180.0 lb

## 2021-01-30 DIAGNOSIS — Z3403 Encounter for supervision of normal first pregnancy, third trimester: Secondary | ICD-10-CM

## 2021-01-30 DIAGNOSIS — Z3A39 39 weeks gestation of pregnancy: Secondary | ICD-10-CM

## 2021-01-30 NOTE — Progress Notes (Signed)
° °  PRENATAL VISIT NOTE  Subjective:  Kelly Henry is a 25 y.o. G1P0000 at [redacted]w[redacted]d being seen today for ongoing prenatal care.  She is currently monitored for the following issues for this low-risk pregnancy and has Asthma, intermittent; Eczema; Supervision of normal first pregnancy, antepartum; and GBS (group B Streptococcus carrier), +RV culture, currently pregnant on their problem list.  Patient reports no complaints.  Contractions: Irregular. Vag. Bleeding: None.  Movement: Present. Denies leaking of fluid.   The following portions of the patient's history were reviewed and updated as appropriate: allergies, current medications, past family history, past medical history, past social history, past surgical history and problem list.   Objective:   Vitals:   01/30/21 0904  BP: 126/81  Pulse: 79  Temp: (!) 97.3 F (36.3 C)  Weight: 180 lb (81.6 kg)    Fetal Status: Fetal Heart Rate (bpm): 139 Fundal Height: 39 cm Movement: Present  Presentation: Vertex  General:  Alert, oriented and cooperative. Patient is in no acute distress.  Skin: Skin is warm and dry. No rash noted.   Cardiovascular: Normal heart rate noted  Respiratory: Normal respiratory effort, no problems with respiration noted  Abdomen: Soft, gravid, appropriate for gestational age.  Pain/Pressure: Present     Pelvic: Cervical exam deferred        Extremities: Normal range of motion.  Edema: Moderate pitting, indentation subsides rapidly  Mental Status: Normal mood and affect. Normal behavior. Normal judgment and thought content.   Assessment and Plan:  Pregnancy: G1P0000 at [redacted]w[redacted]d 1. Supervision of low-risk first pregnancy, third trimester - Doing well, feeling regular and vigorous fetal movement   2. [redacted] weeks gestation of pregnancy - Routine OB care  - Desires waterbirth, will await labor and schedule NST w provider visit at [facility] next week.  - Will schedule IOL for 41-2 at next visit if needed. - Counseled carefully  to come in if DFM - US FETAL BPP WO NON STRESS; Future  Term labor symptoms and general obstetric precautions including but not limited to vaginal bleeding, contractions, leaking of fluid and fetal movement were reviewed in detail with the patient. Please refer to After Visit Summary for other counseling recommendations.   Return in about 1 week (around 02/06/2021) for IN-PERSON, LOB.  Future Appointments  Date Time Provider Department Center  02/06/2021  8:15 AM [facility] NST [facility]    Bernerd Limbo, CNM

## 2021-01-30 NOTE — Patient Instructions (Signed)
The [employer]  This circuit takes at least 90 minutes to complete so clear your schedule and make mental preparations so you can relax in your environment. The second step requires a lot of pillows so gather them up before beginning Before starting, you should empty your bladder! Have a nice drink nearby, and make sure it has a straw! If you are having contractions, this circuit should be done through contractions, try not to change positions between steps Before you begin...  "I named this 'circuit' after my friend Kelly Henry, who shared and discussed it with me when I was working with a client whose labor seemed to be stalled out and no longer progressing... This circuit is useful to help get the baby lined up, ideally, in the "Left Occiput Anterior" (LOA) Position, both before labor begins and when some corrections need to be done during labor. Prenatally, this position set can help to rotate a baby. As a natural method of induction, this can help get things going if baby just needed a gentle nudge of position to set things off. To the best of my knowledge, this group of positions will not "hurt" a baby that is already lined up correctly." - Sharon Muza   Step One: Open-knee Chest Stay in this position for 30 minutes, start in cat/cow, then drop your chest as low as you can to the bed or the floor and your bottom as high as you can. Knees should be fairly wide apart, and the angle between the torso/thighs should be wider than 90 degrees. Wiggle around, prop with lots of pillows and use this time to get totally relaxed. This position allows the baby to scoot out of the pelvis a bit and gives them room to rotate, shift their head position, etc. If the pregnant person finds it helpful, careful positioning with a rebozo under the belly, with gentle tension from a support person behind can help maintain this position for the full 30 minutes.  Step Two:Exaggerated Left Side  Lying Roll to your left side, bringing your top leg as high as possible and keeping your bottom leg straight. Roll forward as much as possible, again using a lot of pillows. Sink into the bed and relax some more. If you fall asleep, that's totally okay and you can stay there! If not, stay here for at least another half an hour. Try and get your top right leg up towards your head and get as rolled over onto your belly as much as possible. If you repeat the circuit during labor, try alternating left and right sides. We know the photo the left is actually right side... just flip the image in your head.  Step Three: Moving and Lunges Lunge, walk stairs facing sideways, 2 at a time, (have a spotter downstairs of you!), take a walk outside with one foot on the curb and the other on the street, sit on a birth ball and hula- anything that's upright and putting your pelvis in open, asymmetrical positions. Spend at least 30 minutes doing this one as well to give your baby a chance to move down. If you are lunging or stair or curb walking, you should lunge/walk/go up stairs in the direction that feels better to you. The key with the lunge is that the toes of the higher leg and mom's belly button should be at right angles. Do not lunge over your knee, that closes the pelvis.     Kelly Hamilton Henry: Circuit Creator - [email or web address] Sharon   Muza, CD, BDT (DONA), LCCE, FACCE: Supporting Content - [email or web address] Emily Weaver Brown: Photography - [email or web address] Kate Dewey CD/CDT (BAI): Print and Webmaster - [email or web address] [employer] [email or web address]  

## 2021-02-02 ENCOUNTER — Encounter (HOSPITAL_COMMUNITY): Payer: Self-pay | Admitting: Family Medicine

## 2021-02-02 ENCOUNTER — Inpatient Hospital Stay (HOSPITAL_COMMUNITY)
Admission: AD | Admit: 2021-02-02 | Discharge: 2021-02-06 | DRG: 786 | Disposition: A | Discharge status: Home / Self Care | Payer: 59 | Attending: Obstetrics and Gynecology | Admitting: Obstetrics and Gynecology

## 2021-02-02 ENCOUNTER — Other Ambulatory Visit: Payer: Self-pay

## 2021-02-02 DIAGNOSIS — R000 Tachycardia, unspecified: Secondary | ICD-10-CM | POA: Diagnosis present

## 2021-02-02 DIAGNOSIS — U071 COVID-19: Secondary | ICD-10-CM | POA: Diagnosis present

## 2021-02-02 DIAGNOSIS — B951 Streptococcus, group B, as the cause of diseases classified elsewhere: Secondary | ICD-10-CM | POA: Diagnosis not present

## 2021-02-02 DIAGNOSIS — O99824 Streptococcus B carrier state complicating childbirth: Secondary | ICD-10-CM | POA: Diagnosis present

## 2021-02-02 DIAGNOSIS — Z3A00 Weeks of gestation of pregnancy not specified: Secondary | ICD-10-CM | POA: Diagnosis not present

## 2021-02-02 DIAGNOSIS — O411230 Chorioamnionitis, third trimester, not applicable or unspecified: Secondary | ICD-10-CM | POA: Diagnosis present

## 2021-02-02 DIAGNOSIS — A6000 Herpesviral infection of urogenital system, unspecified: Secondary | ICD-10-CM | POA: Diagnosis present

## 2021-02-02 DIAGNOSIS — O99892 Other specified diseases and conditions complicating childbirth: Secondary | ICD-10-CM | POA: Diagnosis present

## 2021-02-02 DIAGNOSIS — O9832 Other infections with a predominantly sexual mode of transmission complicating childbirth: Secondary | ICD-10-CM | POA: Diagnosis present

## 2021-02-02 DIAGNOSIS — O9852 Other viral diseases complicating childbirth: Secondary | ICD-10-CM | POA: Diagnosis present

## 2021-02-02 DIAGNOSIS — O1404 Mild to moderate pre-eclampsia, complicating childbirth: Secondary | ICD-10-CM | POA: Diagnosis present

## 2021-02-02 DIAGNOSIS — Z3400 Encounter for supervision of normal first pregnancy, unspecified trimester: Secondary | ICD-10-CM

## 2021-02-02 DIAGNOSIS — Z3A40 40 weeks gestation of pregnancy: Secondary | ICD-10-CM

## 2021-02-02 DIAGNOSIS — O368190 Decreased fetal movements, unspecified trimester, not applicable or unspecified: Secondary | ICD-10-CM | POA: Diagnosis present

## 2021-02-02 DIAGNOSIS — O9952 Diseases of the respiratory system complicating childbirth: Secondary | ICD-10-CM | POA: Diagnosis present

## 2021-02-02 DIAGNOSIS — Z8616 Personal history of COVID-19: Secondary | ICD-10-CM | POA: Diagnosis not present

## 2021-02-02 DIAGNOSIS — Z8759 Personal history of other complications of pregnancy, childbirth and the puerperium: Secondary | ICD-10-CM | POA: Diagnosis present

## 2021-02-02 DIAGNOSIS — O99820 Streptococcus B carrier state complicating pregnancy: Secondary | ICD-10-CM | POA: Diagnosis not present

## 2021-02-02 DIAGNOSIS — O480 Post-term pregnancy: Secondary | ICD-10-CM | POA: Diagnosis present

## 2021-02-02 DIAGNOSIS — O368130 Decreased fetal movements, third trimester, not applicable or unspecified: Secondary | ICD-10-CM | POA: Diagnosis present

## 2021-02-02 DIAGNOSIS — Z87891 Personal history of nicotine dependence: Secondary | ICD-10-CM

## 2021-02-02 DIAGNOSIS — O133 Gestational [pregnancy-induced] hypertension without significant proteinuria, third trimester: Secondary | ICD-10-CM

## 2021-02-02 DIAGNOSIS — J4520 Mild intermittent asthma, uncomplicated: Secondary | ICD-10-CM | POA: Diagnosis present

## 2021-02-02 DIAGNOSIS — R030 Elevated blood-pressure reading, without diagnosis of hypertension: Secondary | ICD-10-CM

## 2021-02-02 HISTORY — DX: Herpesviral infection, unspecified: B00.9

## 2021-02-02 LAB — COMPREHENSIVE METABOLIC PANEL
ALT: 15 U/L (ref 0–44)
AST: 21 U/L (ref 15–41)
Albumin: 3 g/dL — ABNORMAL LOW (ref 3.5–5.0)
Alkaline Phosphatase: 479 U/L — ABNORMAL HIGH (ref 38–126)
Anion gap: 7 (ref 5–15)
BUN: 7 mg/dL (ref 6–20)
CO2: 20 mmol/L — ABNORMAL LOW (ref 22–32)
Calcium: 9 mg/dL (ref 8.9–10.3)
Chloride: 109 mmol/L (ref 98–111)
Creatinine, Ser: 0.71 mg/dL (ref 0.44–1.00)
GFR, Estimated: >60 mL/min (ref >60)
Glucose, Bld: 86 mg/dL (ref 70–99)
Potassium: 3.7 mmol/L (ref 3.5–5.1)
Sodium: 136 mmol/L (ref 135–145)
Total Bilirubin: 0.5 mg/dL (ref 0.3–1.2)
Total Protein: 6.3 g/dL — ABNORMAL LOW (ref 6.5–8.1)

## 2021-02-02 LAB — CBC
HCT: 36.3 % (ref 36.0–46.0)
Hemoglobin: 12.3 g/dL (ref 12.0–15.0)
MCH: 29.7 pg (ref 26.0–34.0)
MCHC: 33.9 g/dL (ref 30.0–36.0)
MCV: 87.7 fL (ref 80.0–100.0)
Platelets: 251 10*3/uL (ref 150–400)
RBC: 4.14 MIL/uL (ref 3.87–5.11)
RDW: 13.2 % (ref 11.5–15.5)
WBC: 6 10*3/uL (ref 4.0–10.5)
nRBC: 0 % (ref 0.0–0.2)

## 2021-02-02 LAB — PROTEIN / CREATININE RATIO, URINE
Creatinine, Urine: 68.79 mg/dL
Protein Creatinine Ratio: 0.35 mg/mg{Cre} — ABNORMAL HIGH (ref 0.00–0.15)
Total Protein, Urine: 24 mg/dL

## 2021-02-02 LAB — TYPE AND SCREEN
ABO/RH(D): O POS
Antibody Screen: NEGATIVE

## 2021-02-02 LAB — RESP PANEL BY RT-PCR (FLU A&B, COVID) ARPGX2
Influenza A by PCR: NEGATIVE
Influenza B by PCR: NEGATIVE
SARS Coronavirus 2 by RT PCR: POSITIVE — AB

## 2021-02-02 MED ORDER — MISOPROSTOL 50MCG HALF TABLET
50.0000 ug | ORAL_TABLET | ORAL | Status: DC | PRN
  Administered 2021-02-02 (×3): 50 ug via ORAL
  Filled 2021-02-02 (×3): qty 1

## 2021-02-02 MED ORDER — SOD CITRATE-CITRIC ACID 500-334 MG/5ML PO SOLN
30.0000 mL | ORAL | Status: DC | PRN
  Filled 2021-02-02: qty 30

## 2021-02-02 MED ORDER — SODIUM CHLORIDE 0.9 % IV SOLN
5.0000 10*6.[IU] | Freq: Once | INTRAVENOUS | Status: AC
  Administered 2021-02-02: 14:00:00 5 10*6.[IU] via INTRAVENOUS
  Filled 2021-02-02: qty 5

## 2021-02-02 MED ORDER — OXYCODONE-ACETAMINOPHEN 5-325 MG PO TABS: 1.0000 | ORAL_TABLET | ORAL | Status: DC | PRN

## 2021-02-02 MED ORDER — ONDANSETRON HCL 4 MG/2ML IJ SOLN: 4.0000 mg | Freq: Four times a day (QID) | INTRAMUSCULAR | Status: DC | PRN

## 2021-02-02 MED ORDER — OXYCODONE-ACETAMINOPHEN 5-325 MG PO TABS: 2.0000 | ORAL_TABLET | ORAL | Status: DC | PRN

## 2021-02-02 MED ORDER — LIDOCAINE HCL (PF) 1 % IJ SOLN: 30.0000 mL | INTRAMUSCULAR | Status: DC | PRN

## 2021-02-02 MED ORDER — OXYTOCIN-SODIUM CHLORIDE 30-0.9 UT/500ML-% IV SOLN
2.5000 [IU]/h | INTRAVENOUS | Status: DC
  Filled 2021-02-02: qty 500

## 2021-02-02 MED ORDER — OXYTOCIN BOLUS FROM INFUSION: 333.0000 mL | Freq: Once | INTRAVENOUS | Status: DC

## 2021-02-02 MED ORDER — LACTATED RINGERS IV SOLN: INTRAVENOUS | Status: DC

## 2021-02-02 MED ORDER — LACTATED RINGERS IV SOLN
500.0000 mL | INTRAVENOUS | Status: DC | PRN
  Administered 2021-02-03: 20:00:00 500 mL via INTRAVENOUS
  Administered 2021-02-03: 07:00:00 300 mL via INTRAVENOUS

## 2021-02-02 MED ORDER — PENICILLIN G POT IN DEXTROSE 60000 UNIT/ML IV SOLN
3.0000 10*6.[IU] | INTRAVENOUS | Status: DC
  Administered 2021-02-02 – 2021-02-03 (×7): 3 10*6.[IU] via INTRAVENOUS
  Filled 2021-02-02 (×8): qty 50

## 2021-02-02 MED ORDER — ACETAMINOPHEN 325 MG PO TABS
650.0000 mg | ORAL_TABLET | ORAL | Status: DC | PRN
  Administered 2021-02-03: 20:00:00 650 mg via ORAL
  Filled 2021-02-02: qty 2

## 2021-02-02 MED ORDER — ZOLPIDEM TARTRATE 5 MG PO TABS
5.0000 mg | ORAL_TABLET | Freq: Every evening | ORAL | Status: DC | PRN
  Administered 2021-02-03: 02:00:00 5 mg via ORAL
  Filled 2021-02-02: qty 1

## 2021-02-02 MED ORDER — TERBUTALINE SULFATE 1 MG/ML IJ SOLN: 0.2500 mg | Freq: Once | INTRAMUSCULAR | Status: DC | PRN

## 2021-02-02 NOTE — MAU Note (Signed)
Presents with c/o decreased FM.  Reports fetus is moving but less than usual despite eating and drinking cold liquids.  Denies VB, has increased white vaginal discharge. Denies LOF.

## 2021-02-02 NOTE — H&P (Addendum)
OBSTETRIC ADMISSION HISTORY AND PHYSICAL  Kelly Henry is a 25 y.o. female G1P0000 with IUP at [redacted]w[redacted]d presenting for decreased FM. While being evaluated in MAU BP was found to be elevated. She reports +FMs. No LOF, VB, blurry vision, headaches, peripheral edema, or RUQ pain. She plans on breastfeeding.   Dating: By Stefan Church [name] --->  Estimated Date of Delivery: 02/01/21  Sono:    @[redacted]w[redacted]d , normal anatomy, ceph presentation, 591g, 48%ile, EFW 1'5   Prenatal History/Complications: -GBS carrier -hx HSV on suppression  Past Medical History: Past Medical History:  Diagnosis Date   Asthma     Past Surgical History: Past Surgical History:  Procedure Laterality Date   NO PAST SURGERIES      Obstetrical History: OB History     Gravida  1   Para  0   Term  0   Preterm  0   AB  0   Living  0      SAB  0   IAB  0   Ectopic  0   Multiple  0   Live Births              Social History: Social History   Socioeconomic History   Marital status: Single    Spouse name: Not on file   Number of children: Not on file   Years of education: Not on file   Highest education level: Not on file  Occupational History   Not on file  Tobacco Use   Smoking status: Former    Types: Cigarettes    Quit date: 03/01/2014    Years since quitting: 6.9   Smokeless tobacco: Never   Tobacco comments:    Mother smokes  Vaping Use   Vaping Use: Never used  Substance and Sexual Activity   Alcohol use: Not Currently    Alcohol/week: 3.0 standard drinks    Types: 3 Standard drinks or equivalent per week   Drug use: Not Currently    Types: Marijuana   Sexual activity: Yes    Birth control/protection: None  Other Topics Concern   Not on file  Social History Narrative   Got GED and work training.  Is now working at 03/03/2014.  Plans on pursuing further degree at [employer] for culinary arts.   Social Determinants of Health   Financial Resource Strain: Not on file  Food Insecurity: Not  on file  Transportation Needs: Not on file  Physical Activity: Not on file  Stress: Not on file  Social Connections: Not on file    Family History: No family history on file.  Allergies: No Known Allergies  Medications Prior to Admission  Medication Sig Dispense Refill Last Dose   cyclobenzaprine (FLEXERIL) 5 MG tablet Take 1-2 tablets (5-10 mg total) by mouth at bedtime as needed for muscle spasms. 10 tablet 0 Past Week   Prenatal Vit-Fe Fumarate-FA (PREPLUS) 27-1 MG TABS Take 1 tablet by mouth daily. 30 tablet 13 02/01/2021   valACYclovir (VALTREX) 500 MG tablet Take 1 tablet (500 mg total) by mouth 2 (two) times daily. 60 tablet 6 02/01/2021   Review of Systems:  All systems reviewed and negative except as stated in HPI  PE: Blood pressure (!) 146/90, pulse 88, temperature 97.9 F (36.6 C), temperature source Oral, resp. rate 18, height 5\' 2"  (1.575 m), weight 81.1 kg, last menstrual period 04/13/2020, SpO2 100 %. Patient Vitals for the past 24 hrs:  BP Temp Temp src Pulse Resp SpO2 Height Weight  02/02/21 1359 (!) 146/90 -- -- 88 18 -- -- --  02/02/21 1301 (!) 152/101 -- -- 88 -- -- -- --  02/02/21 1246 134/87 -- -- 94 -- -- -- --  02/02/21 1241 (!) 142/80 97.9 F (36.6 C) Oral 90 18 100 % -- --  02/02/21 1222 (!) 143/93 97.8 F (36.6 C) Oral (!) 106 18 100 % -- --  02/02/21 1215 -- -- -- -- -- -- 5\' 2"  (1.575 m) 81.1 kg    General appearance: alert, cooperative, and no distress Lungs: regular rate and effort Heart: regular rate  Abdomen: soft, non-tender Extremities: Homans sign is negative, no sign of DVT Presentation: cephalic by BSUS EFM: 150 bpm, mod variability, + accels, no decels Toco: irreg GU: no lesions SVE: closed/50  Prenatal labs: ABO, Rh: --/--/O POS (12/26 1258) Antibody: NEG (12/26 1258) Rubella: 4.74 (06/16 1038) RPR: Non Reactive (10/04 0846)  HBsAg: Negative (06/16 1038)  HIV: Non Reactive (10/04 0846)  GBS: Positive/-- (12/02 0933)  2  hr GTT normal  Prenatal Transfer Tool  Maternal Diabetes: No Genetic Screening: Normal Maternal Ultrasounds/Referrals: Normal Fetal Ultrasounds or other Referrals:  None Maternal Substance Abuse:  No Significant Maternal Medications:  None Significant Maternal Lab Results: Group B Strep positive  Results for orders placed or performed during the hospital encounter of 02/02/21 (from the past 24 hour(s))  Comprehensive metabolic panel   Collection Time: 02/02/21 12:58 PM  Result Value Ref Range   Sodium 136 135 - 145 mmol/L   Potassium 3.7 3.5 - 5.1 mmol/L   Chloride 109 98 - 111 mmol/L   CO2 20 (L) 22 - 32 mmol/L   Glucose, Bld 86 70 - 99 mg/dL   BUN 7 6 - 20 mg/dL   Creatinine, Ser 02/04/21 0.44 - 1.00 mg/dL   Calcium 9.0 8.9 - 1.82 mg/dL   Total Protein 6.3 (L) 6.5 - 8.1 g/dL   Albumin 3.0 (L) 3.5 - 5.0 g/dL   AST 21 15 - 41 U/L   ALT 15 0 - 44 U/L   Alkaline Phosphatase 479 (H) 38 - 126 U/L   Total Bilirubin 0.5 0.3 - 1.2 mg/dL   GFR, Estimated 99.3 >71 mL/min   Anion gap 7 5 - 15  CBC   Collection Time: 02/02/21 12:58 PM  Result Value Ref Range   WBC 6.0 4.0 - 10.5 K/uL   RBC 4.14 3.87 - 5.11 MIL/uL   Hemoglobin 12.3 12.0 - 15.0 g/dL   HCT 02/04/21 67.8 - 93.8 %   MCV 87.7 80.0 - 100.0 fL   MCH 29.7 26.0 - 34.0 pg   MCHC 33.9 30.0 - 36.0 g/dL   RDW 10.1 75.1 - 02.5 %   Platelets 251 150 - 400 K/uL   nRBC 0.0 0.0 - 0.2 %  Type and screen [facility]   Collection Time: 02/02/21 12:58 PM  Result Value Ref Range   ABO/RH(D) O POS    Antibody Screen NEG    Sample Expiration      02/05/2021,2359 Performed at [facility], [address], [place], [place]     Patient Active Problem List   Diagnosis Date Noted   GBS (group B Streptococcus carrier), +RV culture, currently pregnant 01/15/2021   Supervision of normal first pregnancy, antepartum 07/24/2020   Eczema 09/18/2008   Asthma, intermittent 02/06/2008    Assessment: Kelly Henry is a 25 y.o. G1P0000 at [redacted]w[redacted]d here for IOL for elevated BP and  dFM  1. Labor: latent 2. FWB: Cat I 3. Pain: analgesia/anesthesia/water immersion 4. GBS: pos   Plan: Admit to LD PEC labs Cervical ripening w/Cytotec, FB when able Desires waterbirth if doesn't risk out-lengthy discussion of contraindications Anticipate SVD  Donette Larry, CNM  02/02/2021, 2:47 PM

## 2021-02-02 NOTE — Progress Notes (Signed)
Patient Vitals for the past 4 hrs:  BP Pulse Resp  02/02/21 2140 122/68 79 18  02/02/21 2025 (!) 142/86 90 18   Mild ctx q 3 minutes. FHR Cat 1.  Cx FT/long/_2,  Foley inserted and inflated w/60cc H20.  Continue buccal cytotec.

## 2021-02-02 NOTE — Progress Notes (Signed)
Labor Progress Note Kelly Henry is a 25 y.o. G1P0000 at [redacted]w[redacted]d presented for IOL for DFM and PEC  S:  Feeling some mild ctx, resting on side.   O:  BP (!) 152/91    Pulse 92    Temp 97.9 F (36.6 C) (Oral)    Resp 18    Ht 5\' 2"  (1.575 m)    Wt 81.1 kg    LMP 04/13/2020    SpO2 100%    BMI 32.70 kg/m  EFM: baseline 135 bpm/ mod variability/ + accels/ no decels  Toco/IUPC: irreg SVE: Dilation: Closed Effacement (%): 50 Presentation: Vertex (confirmed with BS 06/13/2020) Exam by:: [email or web address] CNM   A/P: 25 y.o. G1P0000 [redacted]w[redacted]d  1. Labor: latent 2. FWB: Cat I 3. Pain: analgesia/anesthesia/NO prn 4. PEC: stable 5. Covid positive: asymptomatic  S/p Cytotec x1. Continue Cytotec. FB when able. Anticipate SVD.  [redacted]w[redacted]d, CNM 6:53 PM

## 2021-02-02 NOTE — MAU Provider Note (Signed)
History     CSN: [id number]  Arrival date and time: 02/02/21 1207   Event Date/Time   First Provider Initiated Contact with Patient 02/02/21 1303      Chief Complaint  Patient presents with   Decreased Fetal Movement   HPI This is a 25 yo G1P0 at [redacted]w[redacted]d with a low risk pregnancy.  Patient seen for decreased fetal movement that started earlier today.  No palliating or provoking factors.  Additionally, her blood pressures have been slightly elevated.  Denies headache, abdominal pain, vision changes.  OB History     Gravida  1   Para  0   Term  0   Preterm  0   AB  0   Living  0      SAB  0   IAB  0   Ectopic  0   Multiple  0   Live Births              Past Medical History:  Diagnosis Date   Asthma     Past Surgical History:  Procedure Laterality Date   NO PAST SURGERIES      No family history on file.  Social History   Tobacco Use   Smoking status: Former    Types: Cigarettes    Quit date: 03/01/2014    Years since quitting: 6.9   Smokeless tobacco: Never   Tobacco comments:    Mother smokes  Vaping Use   Vaping Use: Never used  Substance Use Topics   Alcohol use: Not Currently    Alcohol/week: 3.0 standard drinks    Types: 3 Standard drinks or equivalent per week   Drug use: Not Currently    Types: Marijuana    Allergies: No Known Allergies  Medications Prior to Admission  Medication Sig Dispense Refill Last Dose   cyclobenzaprine (FLEXERIL) 5 MG tablet Take 1-2 tablets (5-10 mg total) by mouth at bedtime as needed for muscle spasms. 10 tablet 0 Past Week   Prenatal Vit-Fe Fumarate-FA (PREPLUS) 27-1 MG TABS Take 1 tablet by mouth daily. 30 tablet 13 02/01/2021   valACYclovir (VALTREX) 500 MG tablet Take 1 tablet (500 mg total) by mouth 2 (two) times daily. 60 tablet 6 02/01/2021    Review of Systems Physical Exam   Blood pressure (!) 152/101, pulse 88, temperature 97.9 F (36.6 C), temperature source Oral, resp. rate 18,  height 5\' 2"  (1.575 m), weight 81.1 kg, last menstrual period 04/13/2020, SpO2 100 %.  Patient Vitals for the past 24 hrs:  BP Temp Temp src Pulse Resp SpO2 Height Weight  02/02/21 1301 (!) 152/101 -- -- 88 -- -- -- --  02/02/21 1246 134/87 -- -- 94 -- -- -- --  02/02/21 1241 (!) 142/80 97.9 F (36.6 C) Oral 90 18 100 % -- --  02/02/21 1222 (!) 143/93 97.8 F (36.6 C) Oral (!) 106 18 100 % -- --  02/02/21 1215 -- -- -- -- -- -- 5\' 2"  (1.575 m) 81.1 kg    Physical Exam Constitutional:      Appearance: Normal appearance.  Cardiovascular:     Rate and Rhythm: Normal rate and regular rhythm.  Pulmonary:     Effort: Pulmonary effort is normal.     Breath sounds: Normal breath sounds.  Abdominal:     General: Abdomen is flat. There is no distension.     Palpations: Abdomen is soft.     Tenderness: There is no abdominal tenderness. There is no guarding or  rebound.  Skin:    General: Skin is warm and dry.     Capillary Refill: Capillary refill takes less than 2 seconds.  Neurological:     General: No focal deficit present.     Mental Status: She is alert and oriented to person, place, and time.  Psychiatric:        Mood and Affect: Mood normal.        Behavior: Behavior normal.        Thought Content: Thought content normal.        Judgment: Judgment normal.    MAU Course  Procedures  MDM  Assessment and Plan   1. Supervision of normal first pregnancy, antepartum   2. GBS (group B Streptococcus carrier), +RV culture, currently pregnant   3. [redacted] weeks gestation of pregnancy   4. Elevated BP without diagnosis of hypertension   5. Decreased fetal movements in third trimester, single or unspecified fetus    Admit for induction.  Levie Heritage 02/02/2021, 1:09 PM

## 2021-02-03 ENCOUNTER — Inpatient Hospital Stay (HOSPITAL_COMMUNITY): Payer: 59 | Admitting: Anesthesiology

## 2021-02-03 LAB — CBC
HCT: 35.1 % — ABNORMAL LOW (ref 36.0–46.0)
Hemoglobin: 11.5 g/dL — ABNORMAL LOW (ref 12.0–15.0)
MCH: 29.2 pg (ref 26.0–34.0)
MCHC: 32.8 g/dL (ref 30.0–36.0)
MCV: 89.1 fL (ref 80.0–100.0)
Platelets: 250 10*3/uL (ref 150–400)
RBC: 3.94 MIL/uL (ref 3.87–5.11)
RDW: 13.2 % (ref 11.5–15.5)
WBC: 8.9 10*3/uL (ref 4.0–10.5)
nRBC: 0 % (ref 0.0–0.2)

## 2021-02-03 LAB — RPR: RPR Ser Ql: NONREACTIVE

## 2021-02-03 MED ORDER — TERBUTALINE SULFATE 1 MG/ML IJ SOLN: 0.2500 mg | Freq: Once | INTRAMUSCULAR | Status: DC | PRN

## 2021-02-03 MED ORDER — GENTAMICIN SULFATE 40 MG/ML IJ SOLN
5.0000 mg/kg | INTRAVENOUS | Status: AC
  Administered 2021-02-03: 21:00:00 310 mg via INTRAVENOUS
  Filled 2021-02-03: qty 7.75

## 2021-02-03 MED ORDER — SODIUM CHLORIDE 0.9 % IV SOLN
2.0000 g | Freq: Four times a day (QID) | INTRAVENOUS | Status: DC
  Administered 2021-02-03: 20:00:00 2 g via INTRAVENOUS
  Filled 2021-02-03: qty 2000

## 2021-02-03 MED ORDER — LACTATED RINGERS AMNIOINFUSION: INTRAVENOUS | Status: DC

## 2021-02-03 MED ORDER — SODIUM CHLORIDE 0.9 % IV SOLN
INTRAVENOUS | Status: AC
  Filled 2021-02-03: qty 5

## 2021-02-03 MED ORDER — FENTANYL CITRATE (PF) 100 MCG/2ML IJ SOLN
INTRAMUSCULAR | Status: AC
  Administered 2021-02-03: 08:00:00 100 ug via INTRAVENOUS
  Filled 2021-02-03: qty 2

## 2021-02-03 MED ORDER — EPHEDRINE 5 MG/ML INJ: 10.0000 mg | INTRAVENOUS | Status: DC | PRN

## 2021-02-03 MED ORDER — LACTATED RINGERS IV SOLN: 500.0000 mL | Freq: Once | INTRAVENOUS | Status: DC

## 2021-02-03 MED ORDER — FENTANYL CITRATE (PF) 100 MCG/2ML IJ SOLN
INTRAMUSCULAR | Status: AC
  Filled 2021-02-03: qty 2

## 2021-02-03 MED ORDER — DIPHENHYDRAMINE HCL 50 MG/ML IJ SOLN: 12.5000 mg | INTRAMUSCULAR | Status: DC | PRN

## 2021-02-03 MED ORDER — SODIUM CHLORIDE 0.9 % IV SOLN
500.0000 mg | INTRAVENOUS | Status: AC
  Administered 2021-02-04: 500 mg via INTRAVENOUS

## 2021-02-03 MED ORDER — PHENYLEPHRINE 40 MCG/ML (10ML) SYRINGE FOR IV PUSH (FOR BLOOD PRESSURE SUPPORT): 80.0000 ug | PREFILLED_SYRINGE | INTRAVENOUS | Status: DC | PRN

## 2021-02-03 MED ORDER — MORPHINE SULFATE (PF) 0.5 MG/ML IJ SOLN
INTRAMUSCULAR | Status: AC
  Filled 2021-02-03: qty 10

## 2021-02-03 MED ORDER — OXYTOCIN-SODIUM CHLORIDE 30-0.9 UT/500ML-% IV SOLN
1.0000 m[IU]/min | INTRAVENOUS | Status: DC
Start: 2021-02-03 — End: 2021-02-04

## 2021-02-03 MED ORDER — OXYTOCIN-SODIUM CHLORIDE 30-0.9 UT/500ML-% IV SOLN
1.0000 m[IU]/min | INTRAVENOUS | Status: DC
  Administered 2021-02-03: 04:00:00 2 m[IU]/min via INTRAVENOUS

## 2021-02-03 MED ORDER — FENTANYL CITRATE (PF) 100 MCG/2ML IJ SOLN
50.0000 ug | INTRAMUSCULAR | Status: DC | PRN
Start: 2021-02-03 — End: 2021-02-04
  Administered 2021-02-03: 02:00:00 50 ug via INTRAVENOUS
  Filled 2021-02-03: qty 2

## 2021-02-03 MED ORDER — CEFAZOLIN SODIUM-DEXTROSE 2-4 GM/100ML-% IV SOLN
2.0000 g | INTRAVENOUS | Status: AC
  Administered 2021-02-04: 2 g via INTRAVENOUS

## 2021-02-03 MED ORDER — SOD CITRATE-CITRIC ACID 500-334 MG/5ML PO SOLN: 30.0000 mL | ORAL | Status: DC

## 2021-02-03 MED ORDER — LIDOCAINE HCL (PF) 1 % IJ SOLN
INTRAMUSCULAR | Status: DC | PRN
  Administered 2021-02-03: 10 mL via EPIDURAL

## 2021-02-03 MED ORDER — FENTANYL-BUPIVACAINE-NACL 0.5-0.125-0.9 MG/250ML-% EP SOLN
12.0000 mL/h | EPIDURAL | Status: DC | PRN
  Administered 2021-02-03: 11:00:00 12 mL/h via EPIDURAL
  Filled 2021-02-03: qty 250

## 2021-02-03 NOTE — Anesthesia Preprocedure Evaluation (Addendum)
Anesthesia Evaluation  Patient identified by MRN, date of birth, ID band Patient awake    Reviewed: Allergy & Precautions, H&P , NPO status , Patient's Chart, lab work & pertinent test results  History of Anesthesia Complications Negative for: history of anesthetic complications  Airway Mallampati: II  TM Distance: >3 FB     Dental   Pulmonary asthma , Recent URI  (COVID+), former smoker,    Pulmonary exam normal        Cardiovascular hypertension (gestational),  Rhythm:regular Rate:Normal     Neuro/Psych negative neurological ROS  negative psych ROS   GI/Hepatic negative GI ROS, Neg liver ROS,   Endo/Other  negative endocrine ROS  Renal/GU negative Renal ROS  negative genitourinary   Musculoskeletal   Abdominal   Peds  Hematology negative hematology ROS (+)   Anesthesia Other Findings   Reproductive/Obstetrics (+) Pregnancy                             Anesthesia Physical Anesthesia Plan  ASA: 2 and emergent  Anesthesia Plan: Epidural   Post-op Pain Management:    Induction:   PONV Risk Score and Plan:   Airway Management Planned:   Additional Equipment:   Intra-op Plan:   Post-operative Plan:   Informed Consent: I have reviewed the patients History and Physical, chart, labs and discussed the procedure including the risks, benefits and alternatives for the proposed anesthesia with the patient or authorized representative who has indicated his/her understanding and acceptance.       Plan Discussed with:   Anesthesia Plan Comments: (**EPIDURAL TO C-SECTION for Fetal intolerance to labor**)       Anesthesia Quick Evaluation

## 2021-02-03 NOTE — Progress Notes (Signed)
Balloon fell out around 0200. Cx 4.5/60/-2.  FHR Cat 1. Pitocin at 12 mu/min. Ctx still mild.  Continue to titrate pitocin.

## 2021-02-03 NOTE — Anesthesia Procedure Notes (Signed)
Epidural Patient location during procedure: OB Start time: 02/03/2021 10:40 AM End time: 02/03/2021 10:49 AM  Staffing Anesthesiologist: Lucretia Kern, MD Performed: anesthesiologist   Preanesthetic Checklist Completed: patient identified, IV checked, risks and benefits discussed, monitors and equipment checked, pre-op evaluation and timeout performed  Epidural Patient position: sitting Prep: DuraPrep Patient monitoring: heart rate, continuous pulse ox and blood pressure Approach: midline Location: L3-L4 Injection technique: LOR air  Needle:  Needle type: Tuohy  Needle gauge: 17 G Needle length: 9 cm Needle insertion depth: 6 cm Catheter type: closed end flexible Catheter size: 19 Gauge Catheter at skin depth: 11 cm Test dose: negative  Assessment Events: blood not aspirated, injection not painful, no injection resistance, no paresthesia and negative IV test  Additional Notes Reason for block:procedure for pain

## 2021-02-03 NOTE — Progress Notes (Signed)
Labor Progress Note Kelly Henry is a 25 y.o. G1P0000 at [redacted]w[redacted]d presented for IOL for DFM and PEC without severe features.  S: Patient resting comfortably with epidural. Had first true fever at 100.6, patient is tachycardic at 122. May be due to COVID however, given she was asymptomatic when came in, will treat for chorio with Amp/Gent, discussed with patient. S/P pitocin break, will restart and titrate 4x4 as tolerated.  O:  BP 117/68 (BP Location: Left Arm)    Pulse (!) 122    Temp (!) 100.6 F (38.1 C) (Axillary)    Resp 14    Ht 5\' 2"  (1.575 m)    Wt 81.1 kg    LMP 04/13/2020    SpO2 100%    BMI 32.70 kg/m  EFM: 150/mod var/no accels, no decels  CVE: Dilation: 4 Effacement (%): 70 Cervical Position: Middle Station: -2 Presentation: Vertex Exam by:: Dr [email or web address]   A&P: 25 y.o. G1P0000 [redacted]w[redacted]d IOL for PEC w/o SF, also +COVID (asymptomatic). #Labor:  s/p pitocin break, restarting pit now. IUPC placed at 18:00, not adequate MVUs and pit was at 30.  #Pain: Epidural #FWB: Cat I #GBS positive - PCN, but switching to amp now with triple I #Chorio: +fever and tachycardic. Start Amp/Gent.   [redacted]w[redacted]d, DO 7:46 PM

## 2021-02-03 NOTE — Plan of Care (Signed)
°  Problem: Education: Goal: Knowledge of Childbirth will improve Outcome: Progressing   Problem: Education: Goal: Ability to make informed decisions regarding treatment and plan of care will improve Outcome: Progressing

## 2021-02-03 NOTE — Progress Notes (Signed)
Confirmed with patient that she would like to proceed with primary cesarean section.  Pitocin discontinued.  Pt did state she would like one hour to prepare for the procedure.  The risks of surgery were discussed with the patient including but were not limited to: bleeding which may require transfusion or reoperation; infection which may require antibiotics; injury to bowel, bladder, ureters or other surrounding organs; injury to the fetus; need for additional procedures including hysterectomy in the event of a life-threatening hemorrhage; formation of adhesions; placental abnormalities wth subsequent pregnancies; incisional problems; thromboembolic phenomenon and other postoperative/anesthesia complications.  The patient concurred with the proposed plan, giving informed written consent for the procedure.   Patient has been NPO since labor induction she will remain NPO for procedure. Anesthesia and OR aware. Preoperative prophylactic antibiotics and SCDs ordered on call to the OR.  To OR when ready.    Kelly Aloe, MD Faculty attending, [facility] [employer].

## 2021-02-03 NOTE — Progress Notes (Signed)
Kelly Henry is a 25 y.o. G1P0000 at [redacted]w[redacted]d  admitted for induction of labor due to Pre-eclamptic toxemia of pregnancy..  Subjective:  Comfortable with epidural. Not feeling pressure at this time.   Objective: BP 128/69    Pulse 89    Temp (!) 100.5 F (38.1 C) (Axillary)    Resp 14    Ht 5\' 2"  (1.575 m)    Wt 81.1 kg    LMP 04/13/2020    SpO2 100%    BMI 32.70 kg/m  I/O last 3 completed shifts: In: 1678.1 [I.V.:1528.1; IV Piggyback:150] Out: 1300 [Urine:1300] No intake/output data recorded.  FHT:  FHR: 155 bpm, variability: moderate,  accelerations:  Present,  decelerations:  Present late onset variables versus lates  UC:   regular, every 2-5 minutes SVE:   Dilation: 4.5 Effacement (%): 70 Station: -2 Exam by:: [email or web address] CNM  Labs: Lab Results  Component Value Date   WBC 8.9 02/03/2021   HGB 11.5 (L) 02/03/2021   HCT 35.1 (L) 02/03/2021   MCV 89.1 02/03/2021   PLT 250 02/03/2021    Assessment / Plan: Protracted latent phase  Labor:  prolonged latent phase  Preeclampsia:   no severe features  Fetal Wellbeing:  Category II Pain Control:  Epidural I/D:  n/a Anticipated MOD:   guarded for NSVD, will update Dr. 02/05/2021 DNP, CNM  02/03/21  11:33 PM    Called by midwife with update of situation.  Cervix essentially unchanged since 0200 since foley bulb fell out.  Last temp 100.5 at 2201.  Pt continues with amp/gent for presumed chorioamnionitis.  Pitocin now at 14 mu/min.  Pt has never been adequate , amnioinfusion currently running.  Discussed with patient and family member current state of labor, ie lack of cervical change.  Options given including continuing labor with reassessment in 2 hours or convert to operative delivery now. Pt now desires primary c section, will cut off pitocin, pt desires to wait for an hour to process the decision.

## 2021-02-04 ENCOUNTER — Encounter (HOSPITAL_COMMUNITY)
Admission: AD | Disposition: A | Discharge status: Home / Self Care | Payer: Self-pay | Source: Home / Self Care | Attending: Obstetrics and Gynecology

## 2021-02-04 ENCOUNTER — Encounter (HOSPITAL_COMMUNITY): Payer: Self-pay | Admitting: Obstetrics & Gynecology

## 2021-02-04 DIAGNOSIS — O9852 Other viral diseases complicating childbirth: Secondary | ICD-10-CM

## 2021-02-04 DIAGNOSIS — O368130 Decreased fetal movements, third trimester, not applicable or unspecified: Secondary | ICD-10-CM

## 2021-02-04 DIAGNOSIS — O1404 Mild to moderate pre-eclampsia, complicating childbirth: Secondary | ICD-10-CM

## 2021-02-04 DIAGNOSIS — O480 Post-term pregnancy: Secondary | ICD-10-CM

## 2021-02-04 DIAGNOSIS — O411230 Chorioamnionitis, third trimester, not applicable or unspecified: Secondary | ICD-10-CM

## 2021-02-04 DIAGNOSIS — Z3A40 40 weeks gestation of pregnancy: Secondary | ICD-10-CM

## 2021-02-04 DIAGNOSIS — U071 COVID-19: Secondary | ICD-10-CM

## 2021-02-04 DIAGNOSIS — O99820 Streptococcus B carrier state complicating pregnancy: Secondary | ICD-10-CM

## 2021-02-04 LAB — CBC
HCT: 29.7 % — ABNORMAL LOW (ref 36.0–46.0)
HCT: 29 % — ABNORMAL LOW (ref 36.0–46.0)
Hemoglobin: 9.7 g/dL — ABNORMAL LOW (ref 12.0–15.0)
Hemoglobin: 9.6 g/dL — ABNORMAL LOW (ref 12.0–15.0)
MCH: 29.2 pg (ref 26.0–34.0)
MCH: 29.3 pg (ref 26.0–34.0)
MCHC: 32.7 g/dL (ref 30.0–36.0)
MCHC: 33.1 g/dL (ref 30.0–36.0)
MCV: 89.5 fL (ref 80.0–100.0)
MCV: 88.4 fL (ref 80.0–100.0)
Platelets: 191 10*3/uL (ref 150–400)
Platelets: 194 10*3/uL (ref 150–400)
RBC: 3.32 MIL/uL — ABNORMAL LOW (ref 3.87–5.11)
RBC: 3.28 MIL/uL — ABNORMAL LOW (ref 3.87–5.11)
RDW: 13.5 % (ref 11.5–15.5)
RDW: 13.6 % (ref 11.5–15.5)
WBC: 15.9 10*3/uL — ABNORMAL HIGH (ref 4.0–10.5)
WBC: 16.3 10*3/uL — ABNORMAL HIGH (ref 4.0–10.5)
nRBC: 0 % (ref 0.0–0.2)
nRBC: 0 % (ref 0.0–0.2)

## 2021-02-04 LAB — CREATININE, SERUM
Creatinine, Ser: 0.63 mg/dL (ref 0.44–1.00)
GFR, Estimated: >60 mL/min (ref >60)

## 2021-02-04 SURGERY — Surgical Case
Anesthesia: Epidural

## 2021-02-04 MED ORDER — ZOLPIDEM TARTRATE 5 MG PO TABS: 5.0000 mg | ORAL_TABLET | Freq: Every evening | ORAL | Status: DC | PRN

## 2021-02-04 MED ORDER — WITCH HAZEL-GLYCERIN EX PADS: 1.0000 "application " | MEDICATED_PAD | CUTANEOUS | Status: DC | PRN

## 2021-02-04 MED ORDER — MENTHOL 3 MG MT LOZG: 1.0000 | LOZENGE | OROMUCOSAL | Status: DC | PRN

## 2021-02-04 MED ORDER — OXYTOCIN-SODIUM CHLORIDE 30-0.9 UT/500ML-% IV SOLN
INTRAVENOUS | Status: DC | PRN
  Administered 2021-02-04: 30 [IU] via INTRAVENOUS

## 2021-02-04 MED ORDER — SENNOSIDES-DOCUSATE SODIUM 8.6-50 MG PO TABS
2.0000 | ORAL_TABLET | Freq: Every day | ORAL | Status: DC
  Administered 2021-02-05 – 2021-02-06 (×2): 2 via ORAL
  Filled 2021-02-04 (×2): qty 2

## 2021-02-04 MED ORDER — SIMETHICONE 80 MG PO CHEW
80.0000 mg | CHEWABLE_TABLET | Freq: Three times a day (TID) | ORAL | Status: DC
  Administered 2021-02-04 – 2021-02-06 (×6): 80 mg via ORAL
  Filled 2021-02-04 (×6): qty 1

## 2021-02-04 MED ORDER — TETANUS-DIPHTH-ACELL PERTUSSIS 5-2.5-18.5 LF-MCG/0.5 IM SUSY: 0.5000 mL | PREFILLED_SYRINGE | Freq: Once | INTRAMUSCULAR | Status: DC

## 2021-02-04 MED ORDER — METOCLOPRAMIDE HCL 5 MG/ML IJ SOLN
INTRAMUSCULAR | Status: AC
  Filled 2021-02-04: qty 2

## 2021-02-04 MED ORDER — ENOXAPARIN SODIUM 40 MG/0.4ML IJ SOSY
40.0000 mg | PREFILLED_SYRINGE | INTRAMUSCULAR | Status: DC
  Administered 2021-02-04 – 2021-02-05 (×2): 40 mg via SUBCUTANEOUS
  Filled 2021-02-04 (×2): qty 0.4

## 2021-02-04 MED ORDER — DEXMEDETOMIDINE (PRECEDEX) IN NS 20 MCG/5ML (4 MCG/ML) IV SYRINGE
PREFILLED_SYRINGE | INTRAVENOUS | Status: AC
  Filled 2021-02-04: qty 5

## 2021-02-04 MED ORDER — SCOPOLAMINE 1 MG/3DAYS TD PT72
MEDICATED_PATCH | TRANSDERMAL | Status: DC | PRN
  Administered 2021-02-04: 1 via TRANSDERMAL

## 2021-02-04 MED ORDER — LACTATED RINGERS IV SOLN: INTRAVENOUS | Status: DC | PRN

## 2021-02-04 MED ORDER — COCONUT OIL OIL: 1.0000 "application " | TOPICAL_OIL | Status: DC | PRN

## 2021-02-04 MED ORDER — DEXAMETHASONE SODIUM PHOSPHATE 10 MG/ML IJ SOLN
INTRAMUSCULAR | Status: AC
  Filled 2021-02-04: qty 1

## 2021-02-04 MED ORDER — ONDANSETRON HCL 4 MG/2ML IJ SOLN
INTRAMUSCULAR | Status: DC | PRN
  Administered 2021-02-04: 4 mg via INTRAVENOUS

## 2021-02-04 MED ORDER — METOCLOPRAMIDE HCL 5 MG/ML IJ SOLN
INTRAMUSCULAR | Status: DC | PRN
  Administered 2021-02-04: 10 mg via INTRAVENOUS

## 2021-02-04 MED ORDER — SODIUM CHLORIDE 0.9 % IR SOLN
Status: DC | PRN
  Administered 2021-02-04: 1

## 2021-02-04 MED ORDER — MISOPROSTOL 200 MCG PO TABS
ORAL_TABLET | ORAL | Status: AC
  Filled 2021-02-04: qty 1

## 2021-02-04 MED ORDER — ONDANSETRON HCL 4 MG/2ML IJ SOLN
INTRAMUSCULAR | Status: AC
  Filled 2021-02-04: qty 2

## 2021-02-04 MED ORDER — LIDOCAINE-EPINEPHRINE (PF) 2 %-1:200000 IJ SOLN
INTRAMUSCULAR | Status: DC | PRN
  Administered 2021-02-03 – 2021-02-04 (×3): 5 mL via EPIDURAL
  Administered 2021-02-04: 3 mL via EPIDURAL

## 2021-02-04 MED ORDER — MISOPROSTOL 200 MCG PO TABS: 800.0000 ug | ORAL_TABLET | Freq: Once | ORAL | Status: DC

## 2021-02-04 MED ORDER — SCOPOLAMINE 1 MG/3DAYS TD PT72: 1.0000 | MEDICATED_PATCH | Freq: Once | TRANSDERMAL | Status: DC

## 2021-02-04 MED ORDER — NALOXONE HCL 0.4 MG/ML IJ SOLN: 0.4000 mg | INTRAMUSCULAR | Status: DC | PRN

## 2021-02-04 MED ORDER — DEXAMETHASONE SODIUM PHOSPHATE 10 MG/ML IJ SOLN
INTRAMUSCULAR | Status: DC | PRN
  Administered 2021-02-04: 10 mg via INTRAVENOUS

## 2021-02-04 MED ORDER — DIPHENHYDRAMINE HCL 25 MG PO CAPS: 25.0000 mg | ORAL_CAPSULE | ORAL | Status: DC | PRN

## 2021-02-04 MED ORDER — ACETAMINOPHEN 10 MG/ML IV SOLN
INTRAVENOUS | Status: AC
  Filled 2021-02-04: qty 100

## 2021-02-04 MED ORDER — TRANEXAMIC ACID-NACL 1000-0.7 MG/100ML-% IV SOLN
INTRAVENOUS | Status: AC
  Filled 2021-02-04: qty 100

## 2021-02-04 MED ORDER — ACETAMINOPHEN 325 MG PO TABS
650.0000 mg | ORAL_TABLET | ORAL | Status: DC | PRN
  Administered 2021-02-05: 06:00:00 650 mg via ORAL
  Filled 2021-02-04: qty 2

## 2021-02-04 MED ORDER — DEXMEDETOMIDINE (PRECEDEX) IN NS 20 MCG/5ML (4 MCG/ML) IV SYRINGE
PREFILLED_SYRINGE | INTRAVENOUS | Status: DC | PRN
  Administered 2021-02-04: 8 ug via INTRAVENOUS

## 2021-02-04 MED ORDER — ACETAMINOPHEN 10 MG/ML IV SOLN
INTRAVENOUS | Status: DC | PRN
  Administered 2021-02-04: 1000 mg via INTRAVENOUS

## 2021-02-04 MED ORDER — NALOXONE HCL 4 MG/10ML IJ SOLN
1.0000 ug/kg/h | INTRAVENOUS | Status: DC | PRN
  Filled 2021-02-04: qty 5

## 2021-02-04 MED ORDER — PRENATAL MULTIVITAMIN CH
1.0000 | ORAL_TABLET | Freq: Every day | ORAL | Status: DC
  Administered 2021-02-04 – 2021-02-05 (×2): 1 via ORAL
  Filled 2021-02-04 (×2): qty 1

## 2021-02-04 MED ORDER — DIPHENHYDRAMINE HCL 50 MG/ML IJ SOLN: 12.5000 mg | INTRAMUSCULAR | Status: DC | PRN

## 2021-02-04 MED ORDER — DIPHENHYDRAMINE HCL 50 MG/ML IJ SOLN
INTRAMUSCULAR | Status: AC
  Filled 2021-02-04: qty 1

## 2021-02-04 MED ORDER — KETOROLAC TROMETHAMINE 30 MG/ML IJ SOLN: 30.0000 mg | Freq: Four times a day (QID) | INTRAMUSCULAR | Status: AC | PRN

## 2021-02-04 MED ORDER — OXYCODONE HCL 5 MG PO TABS: 5.0000 mg | ORAL_TABLET | ORAL | Status: DC | PRN

## 2021-02-04 MED ORDER — PROMETHAZINE HCL 25 MG/ML IJ SOLN: 6.2500 mg | INTRAMUSCULAR | Status: DC | PRN

## 2021-02-04 MED ORDER — TRANEXAMIC ACID-NACL 1000-0.7 MG/100ML-% IV SOLN
INTRAVENOUS | Status: DC | PRN
  Administered 2021-02-04: 1000 mg via INTRAVENOUS

## 2021-02-04 MED ORDER — SIMETHICONE 80 MG PO CHEW: 80.0000 mg | CHEWABLE_TABLET | ORAL | Status: DC | PRN

## 2021-02-04 MED ORDER — SODIUM CHLORIDE 0.9 % IV SOLN: INTRAVENOUS | Status: DC | PRN

## 2021-02-04 MED ORDER — ONDANSETRON HCL 4 MG/2ML IJ SOLN: 4.0000 mg | Freq: Three times a day (TID) | INTRAMUSCULAR | Status: DC | PRN

## 2021-02-04 MED ORDER — DIPHENHYDRAMINE HCL 25 MG PO CAPS: 25.0000 mg | ORAL_CAPSULE | Freq: Four times a day (QID) | ORAL | Status: DC | PRN

## 2021-02-04 MED ORDER — DIBUCAINE (PERIANAL) 1 % EX OINT: 1.0000 "application " | TOPICAL_OINTMENT | CUTANEOUS | Status: DC | PRN

## 2021-02-04 MED ORDER — OXYTOCIN-SODIUM CHLORIDE 30-0.9 UT/500ML-% IV SOLN
2.5000 [IU]/h | INTRAVENOUS | Status: AC
  Administered 2021-02-04: 06:00:00 2.5 [IU]/h via INTRAVENOUS
  Filled 2021-02-04: qty 500

## 2021-02-04 MED ORDER — MISOPROSTOL 25 MCG QUARTER TABLET
ORAL_TABLET | ORAL | Status: DC | PRN
  Administered 2021-02-04: 800 ug via RECTAL

## 2021-02-04 MED ORDER — OXYTOCIN-SODIUM CHLORIDE 30-0.9 UT/500ML-% IV SOLN
INTRAVENOUS | Status: AC
  Filled 2021-02-04: qty 500

## 2021-02-04 MED ORDER — IBUPROFEN 600 MG PO TABS
600.0000 mg | ORAL_TABLET | Freq: Four times a day (QID) | ORAL | Status: DC
  Administered 2021-02-04 – 2021-02-06 (×8): 600 mg via ORAL
  Filled 2021-02-04 (×8): qty 1

## 2021-02-04 MED ORDER — ACETAMINOPHEN 500 MG PO TABS
1000.0000 mg | ORAL_TABLET | Freq: Four times a day (QID) | ORAL | Status: AC
  Administered 2021-02-04 (×3): 1000 mg via ORAL
  Filled 2021-02-04 (×3): qty 2

## 2021-02-04 MED ORDER — DIPHENHYDRAMINE HCL 50 MG/ML IJ SOLN
INTRAMUSCULAR | Status: DC | PRN
  Administered 2021-02-04: 25 mg via INTRAVENOUS

## 2021-02-04 MED ORDER — FENTANYL CITRATE (PF) 100 MCG/2ML IJ SOLN: 25.0000 ug | INTRAMUSCULAR | Status: DC | PRN

## 2021-02-04 MED ORDER — SODIUM CHLORIDE 0.9% FLUSH: 3.0000 mL | INTRAVENOUS | Status: DC | PRN

## 2021-02-04 SURGICAL SUPPLY — 33 items
APL SKNCLS STERI-STRIP NONHPOA (GAUZE/BANDAGES/DRESSINGS) ×1
BENZOIN TINCTURE PRP APPL 2/3 (GAUZE/BANDAGES/DRESSINGS) ×2 IMPLANT
CLOSURE STERI STRIP 1/2 X4 (GAUZE/BANDAGES/DRESSINGS) ×2 IMPLANT
CLOSURE WOUND 1/2 X4 (GAUZE/BANDAGES/DRESSINGS)
CLOTH BEACON ORANGE TIMEOUT ST (SAFETY) ×3 IMPLANT
DRSG OPSITE POSTOP 4X10 (GAUZE/BANDAGES/DRESSINGS) ×3 IMPLANT
ELECT REM PT RETURN 9FT ADLT (ELECTROSURGICAL) ×3
ELECTRODE REM PT RTRN 9FT ADLT (ELECTROSURGICAL) ×1 IMPLANT
EXTRACTOR VACUUM KIWI (MISCELLANEOUS) IMPLANT
GLOVE BIOGEL PI IND STRL 7.0 (GLOVE) ×1 IMPLANT
GLOVE BIOGEL PI INDICATOR 7.0 (GLOVE) ×2
GLOVE SURG ORTHO 8.0 STRL STRW (GLOVE) ×3 IMPLANT
GOWN STRL REUS W/TWL LRG LVL3 (GOWN DISPOSABLE) ×6 IMPLANT
KIT ABG SYR 3ML LUER SLIP (SYRINGE) IMPLANT
NDL HYPO 25X5/8 SAFETYGLIDE (NEEDLE) IMPLANT
NEEDLE HYPO 25X5/8 SAFETYGLIDE (NEEDLE) IMPLANT
NS IRRIG 1000ML POUR BTL (IV SOLUTION) ×3 IMPLANT
PACK C SECTION WH (CUSTOM PROCEDURE TRAY) ×3 IMPLANT
PAD OB MATERNITY 4.3X12.25 (PERSONAL CARE ITEMS) ×3 IMPLANT
PENCIL SMOKE EVAC W/HOLSTER (ELECTROSURGICAL) ×3 IMPLANT
RTRCTR C-SECT PINK 25CM LRG (MISCELLANEOUS) IMPLANT
STRIP CLOSURE SKIN 1/2X4 (GAUZE/BANDAGES/DRESSINGS) IMPLANT
SUT MON AB-0 CT1 36 (SUTURE) ×6 IMPLANT
SUT PLAIN 0 NONE (SUTURE) IMPLANT
SUT VIC AB 0 CT1 27 (SUTURE) ×6
SUT VIC AB 0 CT1 27XBRD ANBCTR (SUTURE) ×2 IMPLANT
SUT VIC AB 2-0 CT1 27 (SUTURE) ×3
SUT VIC AB 2-0 CT1 TAPERPNT 27 (SUTURE) ×1 IMPLANT
SUT VIC AB 4-0 SH 27 (SUTURE) ×3
SUT VIC AB 4-0 SH 27XANBCTRL (SUTURE) ×1 IMPLANT
TOWEL OR 17X24 6PK STRL BLUE (TOWEL DISPOSABLE) ×3 IMPLANT
TRAY FOLEY W/BAG SLVR 14FR LF (SET/KITS/TRAYS/PACK) ×3 IMPLANT
WATER STERILE IRR 1000ML POUR (IV SOLUTION) ×5 IMPLANT

## 2021-02-04 NOTE — Transfer of Care (Signed)
Immediate Anesthesia Transfer of Care Note  Patient: Kelly Henry  Procedure(s) Performed: CESAREAN SECTION  Patient Location: PACU  Anesthesia Type:Epidural  Level of Consciousness: awake, alert  and oriented  Airway & Oxygen Therapy: Patient Spontanous Breathing  Post-op Assessment: Report given to RN and Post -op Vital signs reviewed and stable  Post vital signs: Reviewed and stable  Last Vitals:  Vitals Value Taken Time  BP 124/87 02/04/21 0115  Temp 37.8 C 02/04/21 0113  Pulse 105 02/04/21 0118  Resp 25 02/04/21 0118  SpO2 100 % 02/04/21 0118  Vitals shown include unvalidated device data.  Last Pain:  Vitals:   02/04/21 0113  TempSrc:   PainSc: 0-No pain         Complications: No notable events documented.

## 2021-02-04 NOTE — Op Note (Signed)
Kelly Henry PROCEDURE DATE: 02/04/2021  PREOPERATIVE DIAGNOSES: Intrauterine pregnancy at [redacted]w[redacted]d weeks gestation; failure to progress: arrest of dilation, mild preeclampsia, chorioamnionitis, Covid positive  POSTOPERATIVE DIAGNOSES: The same  PROCEDURE: PrimaryLow Transverse Cesarean Section  SURGEON:  Dr. Mariel Aloe  ASSISTANT:  n/a  ANESTHESIOLOGY TEAM: Anesthesiologist: Cecile Hearing, MD; Lucretia Kern, MD  INDICATIONS: Kelly Henry is a 25 y.o. G1P0000 at [redacted]w[redacted]d here for cesarean section secondary to the indications listed under preoperative diagnoses; please see preoperative note for further details.  The risks of surgery were discussed with the patient including but were not limited to: bleeding which may require transfusion or reoperation; infection which may require antibiotics; injury to bowel, bladder, ureters or other surrounding organs; injury to the fetus; need for additional procedures including hysterectomy in the event of a life-threatening hemorrhage; formation of adhesions; placental abnormalities wth subsequent pregnancies; incisional problems; thromboembolic phenomenon and other postoperative/anesthesia complications.  The patient concurred with the proposed plan, giving informed written consent for the procedure.    FINDINGS:  Viable female infant in cephalic presentation.  Apgars 9 and 9.  Clear amniotic fluid.  Intact placenta, three vessel cord.  Normal uterus, fallopian tubes and ovaries bilaterally.  ANESTHESIA: Epidural  INTRAVENOUS FLUIDS: 900 ml   ESTIMATED BLOOD LOSS: 836 ml URINE OUTPUT:  150 ml SPECIMENS: Placenta sent to pathology COMPLICATIONS: None immediate  PROCEDURE IN DETAIL:  The patient preoperatively received intravenous antibiotics and had sequential compression devices applied to her lower extremities.  She was then taken to the operating room where the epidural anesthesia was dosed up to surgical level and was found to be  adequate. She was then placed in a dorsal supine position with a leftward tilt, and prepped and draped in a sterile manner.  A foley catheter was placed into her bladder and attached to constant gravity.  After an adequate timeout was performed, a Pfannenstiel skin incision was made with scalpel two fingerbreaths above the pubic symphysis and carried through to the underlying layer of fascia. The fascia was incised in the midline, and this incision was extended bilaterally using the Mayo scissors.  Kocher clamps x 2 were applied to the superior aspect of the fascial incision and the underlying rectus muscles were dissected off bluntly and sharply.  A similar process was carried out on the inferior aspect of the fascial incision. The rectus muscles were separated in the midline and the peritoneum was entered bluntly. The Alexis self-retaining retractor was introduced into the abdominal cavity.  The vesicouterine peritoneum was identified and grasped using smooth pickups.  It was incised and extended laterally using the Metzenbaum scissors.  A bladder flap was then digitally created.  Attention was turned to the lower uterine segment where a low transverse hysterotomy was made with a scalpel and extended bilaterally bluntly.  The infant was successfully delivered, the cord was clamped and cut after one minute, and the infant was handed over to the awaiting neonatology team. Uterine massage was then administered, and the placenta delivered intact with a three-vessel cord. The uterus was then cleared of clots and debris using manual curettage.  The uterine incision was closed with 0 vicryl in a running locked fashion, and an imbricating layer was also placed with 0 vicryl.  Figure-of-eight 0 Vicryl serosal stitches were placed to help with hemostasis.  The pelvis was cleared of all clot and debris with irrigation and suction. Hemostasis was confirmed on all surfaces.  The retractor was removed.  The peritoneum  was  closed with a 2-0 Vicryl running stitch. The fascia was then closed using 0 Vicryl in a running fashion.  The subcutaneous layer was irrigated, and the skin was closed with a 4-0 Vicryl subcuticular stitch. The patient tolerated the procedure well. Sponge, instrument and needle counts were correct x 3.  She was taken to the recovery room in stable condition.    Mariel Aloe, MD, FACOG Obstetrician & Gynecologist, [facility] for [facility], [facility]

## 2021-02-04 NOTE — Anesthesia Postprocedure Evaluation (Signed)
Anesthesia Post Note  Patient: Kelly Henry  Procedure(s) Performed: CESAREAN SECTION     Patient location during evaluation: PACU Anesthesia Type: Epidural Level of consciousness: awake, awake and alert and oriented Pain management: pain level controlled Vital Signs Assessment: post-procedure vital signs reviewed and stable Respiratory status: spontaneous breathing, nonlabored ventilation and respiratory function stable Cardiovascular status: stable Postop Assessment: no headache, no backache, epidural receding, patient able to bend at knees and no signs of nausea or vomiting Anesthetic complications: no   No notable events documented.  Last Vitals:  Vitals:   02/04/21 0320 02/04/21 0415  BP: 136/85 135/86  Pulse: 79 85  Resp: 18 18  Temp: 37.5 C 37.2 C  SpO2: 100% 100%    Last Pain:  Vitals:   02/04/21 0415  TempSrc: Oral  PainSc: 0-No pain   Pain Goal:                   Cecile Hearing

## 2021-02-04 NOTE — Lactation Note (Addendum)
This note was copied from a baby's chart. Lactation Consultation Note  Patient Name: Kelly Henry [id number] Date: 02/04/2021 Reason for consult: Follow-up assessment;Mother's request;Primapara;1st time breastfeeding;Term;Breastfeeding assistance Age:27 hours LC attempted latch, infant not opening wide enough to get more depth on breast. Dimpling noted throughout even with use of 20 NS. Mom setting up DEBP, personal use,  and will post pump after latching q 3 hrs for 15 min.   Mom stated infant fed 2 hrs prior to [facility] arrival. Mom has no signs of compression stripe or nipple trauma.   Plan 1. To feed based on cues 8-12x 24hr period Mom to offer breasts and look for signs of milk transfer. 20 NS provided, Mom to try cross cradle prone latch to get more depth on breast with next feeding.  2. Mom to supplement with EBM 5-7 ml per feeding if infant not latching, offer more.  3. DEBP as stated above.   Mom like to try pumping and offering her eBM. Mom aware DBM option if need to give more volume.   LC tried suck training with chin tug to bring tongue down but remained tight with infant using gum more than tongue.  LC to alert RN  or LC to observe next feeding.   Maternal Data Has patient been taught Hand Expression?: Yes Does the patient have breastfeeding experience prior to this delivery?: No  Feeding Mother's Current Feeding Choice: Breast Milk  LATCH Score                    Lactation Tools Discussed/Used Tools: Pump;Flanges Flange Size: 21 Breast pump type: Other (comment) (personal pump) Pump Education: Setup, frequency, and cleaning;Milk Storage Reason for Pumping: increast stimulation Pumping frequency: every 3 hrs for 15 min  Interventions Interventions: Breast feeding basics reviewed;Assisted with latch;Skin to skin;Breast massage;Hand express;Breast compression;Position options;DEBP;Education;Infant Driven Feeding Algorithm education  Discharge Pump:  Personal  Consult Status Consult Status: Follow-up Date: 02/05/21 Follow-up type: In-patient    Alem Fahl  Nicholson-Springer 02/04/2021, 7:34 PM

## 2021-02-04 NOTE — Lactation Note (Signed)
This note was copied from a baby's chart. Lactation Consultation Note  Patient Name: Kelly Henry [id number] Date: 02/04/2021 Reason for consult: Initial assessment;Term;Primapara Age:27 hours  Mom is a P1 who reports + breast changes w/pregnancy. Mom was assisted with latch. Some dimpling was noted, but infant was also sleepy at breast.   Mom's nipple diameter suggests she needs a size 21 flange. Mom has her [name] with her & says she has size 21 flanges.  Pacifiers noted at bedside. Pacifier education given.   Mom was made aware of O/P services, breastfeeding support groups, community resources (Mom is already aware of [name]), and our phone # for post-discharge questions.    Lurline Hare [facility] 02/04/2021, 8:43 AM

## 2021-02-05 LAB — SURGICAL PATHOLOGY

## 2021-02-05 MED ORDER — NIFEDIPINE ER OSMOTIC RELEASE 30 MG PO TB24: 30.0000 mg | ORAL_TABLET | Freq: Every day | ORAL | Status: DC

## 2021-02-05 MED ORDER — FERROUS SULFATE 325 (65 FE) MG PO TABS
325.0000 mg | ORAL_TABLET | ORAL | Status: DC
  Administered 2021-02-05: 12:00:00 325 mg via ORAL
  Filled 2021-02-05: qty 1

## 2021-02-05 MED ORDER — FUROSEMIDE 20 MG PO TABS
20.0000 mg | ORAL_TABLET | Freq: Every day | ORAL | Status: DC
  Administered 2021-02-05 – 2021-02-06 (×2): 20 mg via ORAL
  Filled 2021-02-05 (×2): qty 1

## 2021-02-05 NOTE — Progress Notes (Signed)
POSTPARTUM PROGRESS NOTE  Subjective: ROSAISELA Henry is a 25 y.o. G1P1001 POD#1 s/p LTCS at [redacted]w[redacted]d.  She reports she doing well. No acute events overnight. She denies any problems with ambulating, voiding or po intake. Denies nausea or vomiting. She has  passed flatus. Pain is well controlled.  Lochia is scant.  Objective: Blood pressure 113/66, pulse 91, temperature 98.1 F (36.7 C), temperature source Oral, resp. rate 18, height 5\' 2"  (1.575 m), weight 81.1 kg, last menstrual period 04/13/2020, SpO2 100 %, unknown if currently breastfeeding.  Physical Exam:  General: alert, cooperative and no distress Chest: no respiratory distress Abdomen: soft, non-tender   incision honeycomb in place and clean/dry/intact  Uterine Fundus: firm, appropriately tender Extremities: No calf swelling or tenderness   1-2+ edema  Recent Labs    02/04/21 0202 02/04/21 0407  HGB 9.7* 9.6*  HCT 29.7* 29.0*    Assessment/Plan: Kelly Henry is a 25 y.o. G1P1001 s/p POD#1 s/p LTCS at [redacted]w[redacted]d.  POD#1: Doing well, pain well-controlled. H/H appropriate.  -- Routine postpartum care -- Encouraged up OOB -- Lovenox for VTE prophylaxis  Routine Postpartum Care -- Contraception: undecided -- Feeding: breast  #hx of preE w/o SF Patient started on lasix 20mg  daily. BP this AM normotensive in 110s/60s. Will monitor and can start Procardia if meets criteria   Circ consent for baby done  Dispo: Plan for discharge POD#2 or 3 per clinical status. [redacted]w[redacted]d, MD, MPH OB Fellow, [facility] for [facility]

## 2021-02-05 NOTE — Lactation Note (Signed)
This note was copied from a baby's chart. Lactation Consultation Note  Patient Name: Kelly Henry [id number] Date: 02/05/2021 Reason for consult: Follow-up assessment;Mother's request;Breastfeeding assistance Age:27 hours Mom states latch going well, decided to [facility] for now and is no longer using her personal pump. Infant adequate urine and stool output.   Mom denies any pain with the latch and denies soreness.  Mom to continue feeding by cues 8-12x 24hr period.  All questions answered at the end of the visit.   Maternal Data    Feeding Mother's Current Feeding Choice: Breast Milk  LATCH Score                    Lactation Tools Discussed/Used    Interventions Interventions: Breast feeding basics reviewed;Hand express;Breast massage;Breast compression;Expressed milk;Education;Infant Driven Feeding Algorithm education  Discharge Pump: Personal  Consult Status Consult Status: Follow-up Date: 02/06/21 Follow-up type: In-patient    Virgina Deakins  Nicholson-Springer 02/05/2021, 7:09 PM

## 2021-02-06 ENCOUNTER — Other Ambulatory Visit (HOSPITAL_COMMUNITY): Payer: Self-pay

## 2021-02-06 ENCOUNTER — Other Ambulatory Visit: Payer: 59

## 2021-02-06 MED ORDER — OXYCODONE HCL 5 MG PO TABS
5.0000 mg | ORAL_TABLET | ORAL | 0 refills | Status: DC | PRN
  Filled 2021-02-06: qty 20, 2d supply, fill #0

## 2021-02-06 MED ORDER — IBUPROFEN 600 MG PO TABS
600.0000 mg | ORAL_TABLET | Freq: Four times a day (QID) | ORAL | 0 refills | Status: DC
  Filled 2021-02-06: qty 30, 8d supply, fill #0

## 2021-02-06 MED ORDER — FERROUS SULFATE 325 (65 FE) MG PO TABS
325.0000 mg | ORAL_TABLET | ORAL | 0 refills | Status: DC
  Filled 2021-02-06: qty 15, 30d supply, fill #0

## 2021-02-06 MED ORDER — FUROSEMIDE 20 MG PO TABS
20.0000 mg | ORAL_TABLET | Freq: Every day | ORAL | 0 refills | Status: DC
  Filled 2021-02-06: qty 5, 5d supply, fill #0

## 2021-02-06 MED ORDER — ACETAMINOPHEN 325 MG PO TABS: 650.0000 mg | ORAL_TABLET | ORAL | Status: DC | PRN

## 2021-02-06 NOTE — Discharge Summary (Signed)
Postpartum Discharge Summary       Patient Name: Kelly Henry DOB: [birth year] MRN: [id number]  Date of admission: 02/02/2021 Delivery date:02/04/2021  Delivering provider: Lynnda Shields A  Date of discharge: 02/06/2021  Admitting diagnosis: Decreased fetal movement [O36.8190] Intrauterine pregnancy: [redacted]w[redacted]d     Secondary diagnosis:  Active Problems:   Post-dates pregnancy   Decreased fetal movement   Lab test positive for detection of COVID-19 virus   Gestational hypertension  Additional problems: preeclampsia  Discharge diagnosis: Term Pregnancy Delivered                                              Post partum procedures: Augmentation: AROM, Pitocin, Cytotec, and IP Foley Complications: Aod, Triple I Hospital course: Induction of Labor With Cesarean Section   25 y.o. yo G1P1001 at [redacted]w[redacted]d was admitted to the hospital 02/02/2021 for induction of labor. Patient had a labor course significant for AOD, Triple I. The patient went for cesarean section due to  the above . Delivery details are as follows: Membrane Rupture Time/Date: 10:53 AM ,02/03/2021   Delivery Method:C-Section, Low Transverse  Details of operation can be found in separate operative Note.  Patient had an uncomplicated postpartum course. She is ambulating, tolerating a regular diet, passing flatus, and urinating well.  Patient is discharged home in stable condition on 02/06/21.      Newborn Data: Birth date:02/04/2021  Birth time:12:22 AM  Gender:Female  Living status:Living  Apgars:9 ,9  Weight:3530 g                                Magnesium Sulfate received: No BMZ received: No Rhophylac:N/A MMR:N/A T-DaP:Given prenatally Flu: No Transfusion:No  Physical exam  Vitals:   02/05/21 0537 02/05/21 1818 02/05/21 1953 02/06/21 0515  BP: 113/66 125/73 125/72 120/85  Pulse: 91 75 82 86  Resp: 18 17    Temp: 98.1 F (36.7 C) 98.4 F (36.9 C)    TempSrc: Oral Oral    SpO2: 100% 100%    Weight:       Height:       General: alert, cooperative, and no distress Lochia: appropriate Uterine Fundus: firm Incision: Healing well with no significant drainage, No significant erythema, Dressing is clean, dry, and intact DVT Evaluation: No evidence of DVT seen on physical exam. Negative Homan's sign. No cords or calf tenderness. Labs: Lab Results  Component Value Date   WBC 16.3 (H) 02/04/2021   HGB 9.6 (L) 02/04/2021   HCT 29.0 (L) 02/04/2021   MCV 88.4 02/04/2021   PLT 194 02/04/2021   CMP Latest Ref Rng & Units 02/04/2021  Glucose 70 - 99 mg/dL -  BUN 6 - 20 mg/dL -  Creatinine 0.44 - 1.00 mg/dL 0.63  Sodium 135 - 145 mmol/L -  Potassium 3.5 - 5.1 mmol/L -  Chloride 98 - 111 mmol/L -  CO2 22 - 32 mmol/L -  Calcium 8.9 - 10.3 mg/dL -  Total Protein 6.5 - 8.1 g/dL -  Total Bilirubin 0.3 - 1.2 mg/dL -  Alkaline Phos 38 - 126 U/L -  AST 15 - 41 U/L -  ALT 0 - 44 U/L -   Edinburgh Score: Edinburgh Postnatal Depression Scale Screening Tool 02/04/2021  I have been able to laugh and see the  funny side of things. 0  I have looked forward with enjoyment to things. 0  I have blamed myself unnecessarily when things went wrong. 1  I have been anxious or worried for no good reason. 1  I have felt scared or panicky for no good reason. 0  Things have been getting on top of me. 1  I have been so unhappy that I have had difficulty sleeping. 0  I have felt sad or miserable. 0  I have been so unhappy that I have been crying. 0  The thought of harming myself has occurred to me. 0  Edinburgh Postnatal Depression Scale Total 3     After visit meds:  Allergies as of 02/06/2021   No Known Allergies      Medication List     STOP taking these medications    cyclobenzaprine 5 MG tablet Commonly known as: FLEXERIL   valACYclovir 500 MG tablet Commonly known as: VALTREX       TAKE these medications    acetaminophen 325 MG tablet Commonly known as: TYLENOL Take 2 tablets (650  mg total) by mouth every 4 (four) hours as needed for mild pain (temperature > 101.5.).   ferrous sulfate 325 (65 FE) MG tablet Take 1 tablet (325 mg total) by mouth every other day. Start taking on: February 07, 2021   furosemide 20 MG tablet Commonly known as: LASIX Take 1 tablet (20 mg total) by mouth daily.   ibuprofen 600 MG tablet Commonly known as: ADVIL Take 1 tablet (600 mg total) by mouth every 6 (six) hours.   oxyCODONE 5 MG immediate release tablet Commonly known as: Oxy IR/ROXICODONE Take 1-2 tablets (5-10 mg total) by mouth every 4 (four) hours as needed for moderate pain.   PrePLUS 27-1 MG Tabs Take 1 tablet by mouth daily.         Discharge home in stable condition Infant Feeding: Breast Infant Disposition:home with mother Discharge instruction: per After Visit Summary and Postpartum booklet. Activity: Advance as tolerated. Pelvic rest for 6 weeks.  Diet: routine diet Future Appointments: Future Appointments  Date Time Provider [place]  03/11/2021  1:55 PM Gabriel Carina, CNM [facility] None   Follow up Visit:  Follow-up Information     [facility]. Go on 03/11/2021.   Specialty: Obstetrics and Gynecology Why: for postpartum checkup.  Call to make an appointment for an incision and blood pressure check (can be virtual) Contact information: [place] [address] [phone]                   02/06/2021 Christin Fudge, CNM

## 2021-02-09 ENCOUNTER — Telehealth: Payer: Self-pay

## 2021-02-09 NOTE — Telephone Encounter (Signed)
Transition Care Management Unsuccessful Follow-up Telephone Call  Date of discharge and from where:  02/06/2021 from [facility]  Attempts:  1st Attempt  Reason for unsuccessful TCM follow-up call:  Left voice message

## 2021-02-10 ENCOUNTER — Encounter: Payer: Self-pay | Admitting: Family Medicine

## 2021-02-10 MED ORDER — NIFEDIPINE ER OSMOTIC RELEASE 30 MG PO TB24: 30.0000 mg | ORAL_TABLET | Freq: Every day | ORAL | 1 refills | Status: DC

## 2021-02-10 NOTE — Telephone Encounter (Signed)
Transition Care Management Follow-up Telephone Call Date of discharge and from where: 02/06/2021 from [facility] How have you been since you were released from the hospital? Pt stated that she is feeling better and did not have any questions or concerns at this time.  Any questions or concerns? No  Items Reviewed: Did the pt receive and understand the discharge instructions provided? Yes  Medications obtained and verified? Yes  Other? No  Any new allergies since your discharge? No  Dietary orders reviewed? No Do you have support at home? Yes   Functional Questionnaire: (I = Independent and D = Dependent) ADLs: I  Bathing/Dressing- I  Meal Prep- I  Eating- I  Maintaining continence- I  Transferring/Ambulation- I  Managing Meds- I   Follow up appointments reviewed:  PCP Hospital f/u appt confirmed? No   Specialist Hospital f/u appt confirmed? Yes  Scheduled to see Edd Arbour, CNM on 03/11/2021 @ 1:55pm. Are transportation arrangements needed? No  If their condition worsens, is the pt aware to call PCP or go to the Emergency Dept.? Yes Was the patient provided with contact information for the PCP's office or ED? Yes Was to pt encouraged to call back with questions or concerns? Yes

## 2021-02-13 ENCOUNTER — Encounter: Payer: Self-pay | Admitting: Family Medicine

## 2021-02-16 ENCOUNTER — Encounter: Payer: Self-pay | Admitting: Family Medicine

## 2021-02-17 ENCOUNTER — Telehealth (HOSPITAL_COMMUNITY): Payer: Self-pay | Admitting: *Deleted

## 2021-02-17 NOTE — Telephone Encounter (Signed)
Attempted hospital discharge follow-up call.Voicemail full. Deforest Hoyles, RN, 02/17/21, [phone]

## 2021-02-19 ENCOUNTER — Encounter: Payer: Self-pay | Admitting: Family Medicine

## 2021-03-11 ENCOUNTER — Ambulatory Visit (INDEPENDENT_AMBULATORY_CARE_PROVIDER_SITE_OTHER): Payer: 59 | Admitting: Certified Nurse Midwife

## 2021-03-11 ENCOUNTER — Encounter: Payer: Self-pay | Admitting: Certified Nurse Midwife

## 2021-03-11 ENCOUNTER — Other Ambulatory Visit: Payer: Self-pay

## 2021-03-11 DIAGNOSIS — Z8759 Personal history of other complications of pregnancy, childbirth and the puerperium: Secondary | ICD-10-CM

## 2021-03-11 NOTE — Progress Notes (Signed)
Post Partum Visit Note  Kelly Henry is a 26 y.o. G24P1001 female who presents for a postpartum visit. She is 5 weeks postpartum following a primary cesarean section.  I have fully reviewed the prenatal and intrapartum course. The delivery was at 40w 3d.  Anesthesia: epidural. Postpartum course has been uncomplicated. Baby is doing well. Baby is feeding by breast. Bleeding no bleeding. Bowel function is normal. Bladder function is normal. Patient is not sexually active. Contraception method is condoms. Postpartum depression screening: negative.   Upstream - 03/12/21 5681       Pregnancy Intention Screening   Does the patient want to become pregnant in the next year? No    Does the patient's partner want to become pregnant in the next year? No    Would the patient like to discuss contraceptive options today? Yes      Contraception Wrap Up   Current Method Abstinence    End Method Female Condom    Contraception Counseling Provided Yes            The pregnancy intention screening data noted above was reviewed. Potential methods of contraception were discussed. The patient elected to proceed with Female Condom.   Edinburgh Postnatal Depression Scale - 03/11/21 1435       Edinburgh Postnatal Depression Scale:  In the Past 7 Days   I have been able to laugh and see the funny side of things. 0    I have looked forward with enjoyment to things. 0    I have blamed myself unnecessarily when things went wrong. 0    I have been anxious or worried for no good reason. 0    I have felt scared or panicky for no good reason. 0    Things have been getting on top of me. 0    I have been so unhappy that I have had difficulty sleeping. 0    I have felt sad or miserable. 0    I have been so unhappy that I have been crying. 0    The thought of harming myself has occurred to me. 0    Edinburgh Postnatal Depression Scale Total 0            Health Maintenance Due  Topic Date Due   COVID-19  Vaccine (1) Never done   The following portions of the patient's history were reviewed and updated as appropriate: allergies, current medications, past family history, past medical history, past social history, past surgical history, and problem list.  Review of Systems Pertinent items noted in HPI and remainder of comprehensive ROS otherwise negative.  Objective:  BP 138/86    Pulse 73    Wt 160 lb 12.8 oz (72.9 kg)    LMP 04/13/2020    Breastfeeding Yes    BMI 29.41 kg/m    General:  alert, cooperative, and no distress   Breasts:  normal  Lungs: Normal effort, no problems noted  Heart:  regular rate and rhythm  Abdomen: Soft, non-tender    Wound well approximated incision  GU exam:  not indicated       Assessment:   Postpartum care and examination of lactating mother  Plan:   Essential components of care per ACOG recommendations:  1.  Mood and well being: Patient with negative depression screening today. Reviewed local resources for support.  - Patient tobacco use? No.   - hx of drug use? No.    2. Infant care and feeding:  -  Patient currently breastmilk feeding? Yes. Reviewed importance of draining breast regularly to support lactation.  -Social determinants of health (SDOH) reviewed in EPIC. No concerns  3. Sexuality, contraception and birth spacing - Patient does not want a pregnancy in the next year.  Desired family size is currently unknown, they do want more children eventually.  - Reviewed forms of contraception in tiered fashion. Patient desired condoms today.   - Discussed birth spacing of 18 months  4. Sleep and fatigue -Encouraged family/partner/community support of 4 hrs of uninterrupted sleep to help with mood and fatigue  5. Physical Recovery  - Discussed patients delivery and complications. She describes her labor as mixed. - Patient had a C-section failure to progress. Patient had  no  laceration. Perineal healing reviewed. Patient expressed  understanding - Patient has urinary incontinence? No. - Patient is safe to resume physical and sexual activity  6.  Health Maintenance - HM due items addressed Yes - Last pap smear  Diagnosis  Date Value Ref Range Status  07/24/2020   Final   - Negative for intraepithelial lesion or malignancy (NILM)   Pap smear not done at today's visit.  -Breast Cancer screening indicated? No.   7. Chronic Disease/Pregnancy Condition follow up: None (HTN resolved) - PCP follow up as needed  Gabriel Carina, [facility] [place]

## 2021-05-01 IMAGING — US US OB < 14 WEEKS - US OB TV
1 series · 15 of 28 positions shown · non-contrast
Comparison: None.

CLINICAL DATA: Pelvic pain and cramping.

EXAM:
OBSTETRIC <14 WK US AND TRANSVAGINAL OB US
TECHNIQUE: Both transabdominal and transvaginal ultrasound examinations were
performed for complete evaluation of the gestation as well as the
maternal uterus, adnexal regions, and pelvic cul-de-sac.
Transvaginal technique was performed to assess early pregnancy.

[Series 1: us ob < 14 weeks - us ob tv · 15 of 50 slices shown]
[im 1/50]
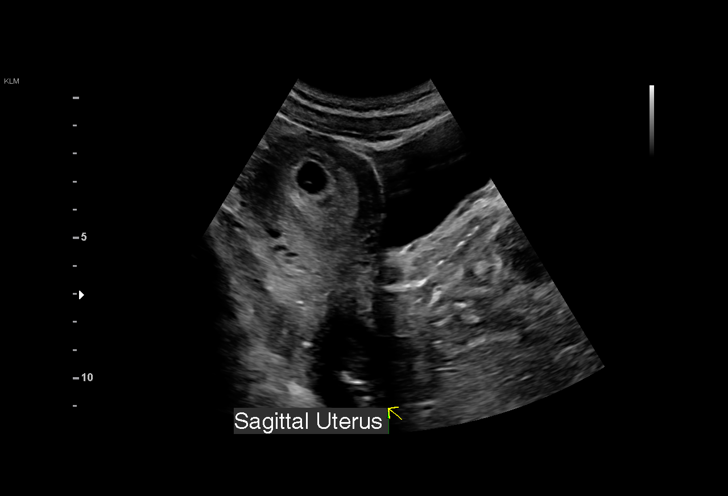
[im 4/50]
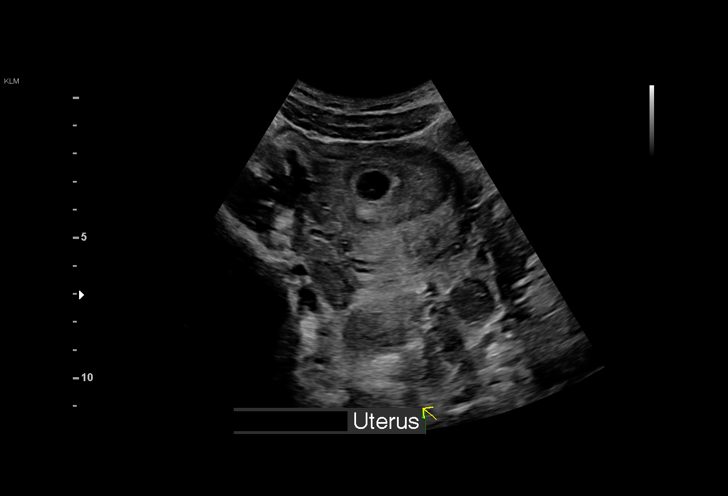
[im 8/50]
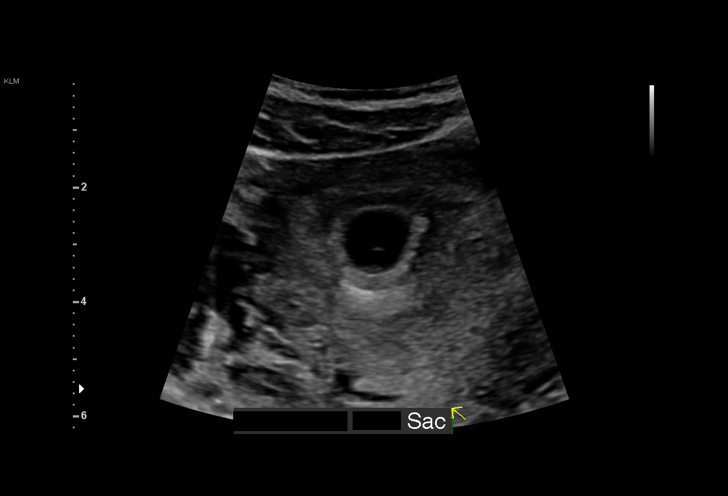
[im 11/50]
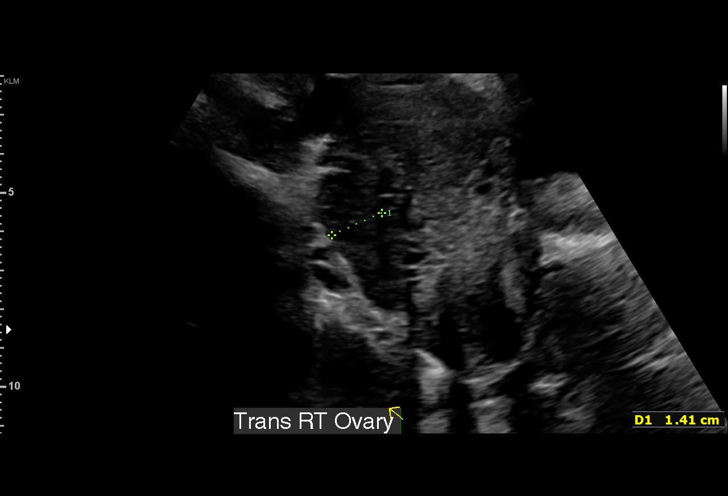
[im 15/50]
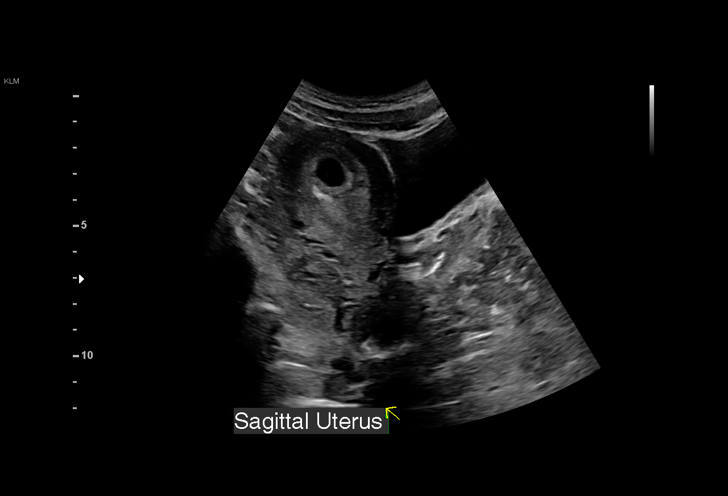
[im 19/50]
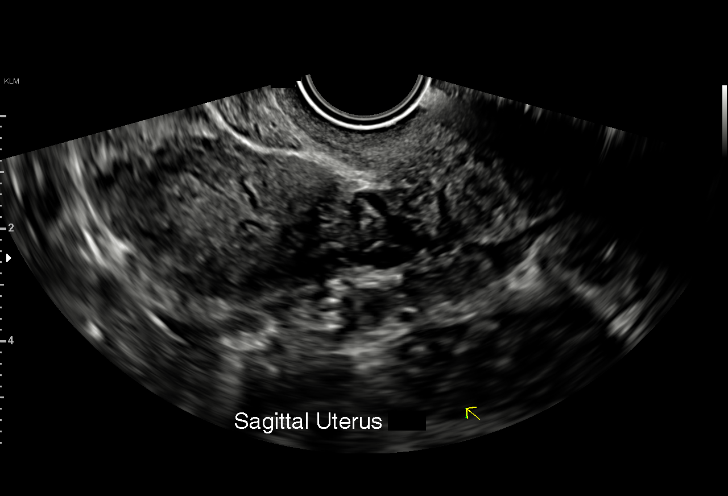
[im 22/50]
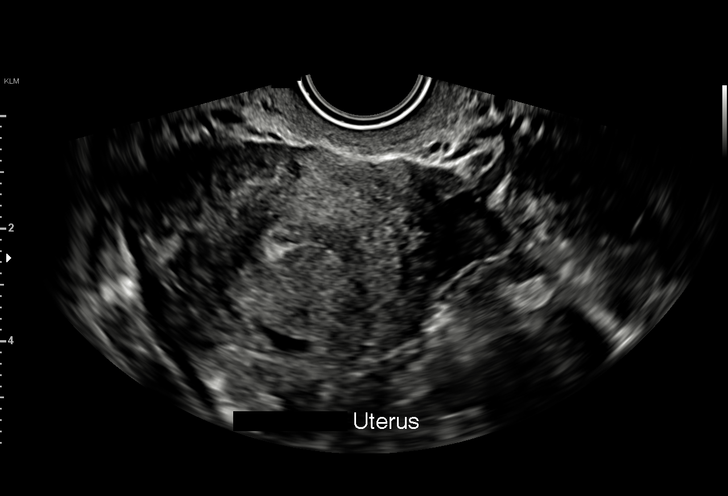
[im 26/50]
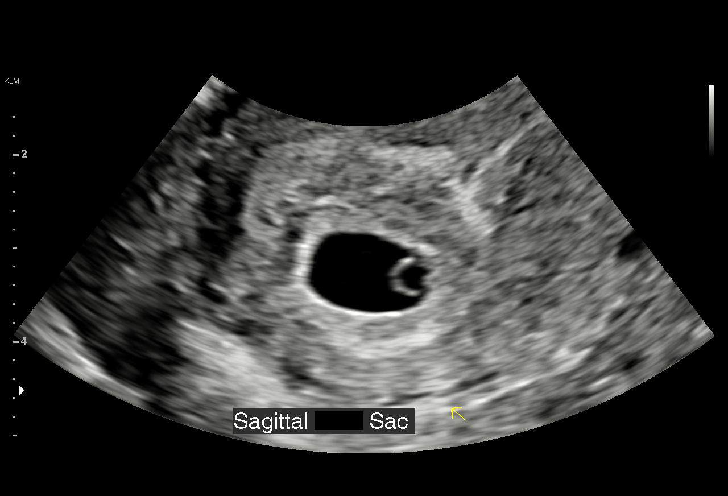
[im 28/50]
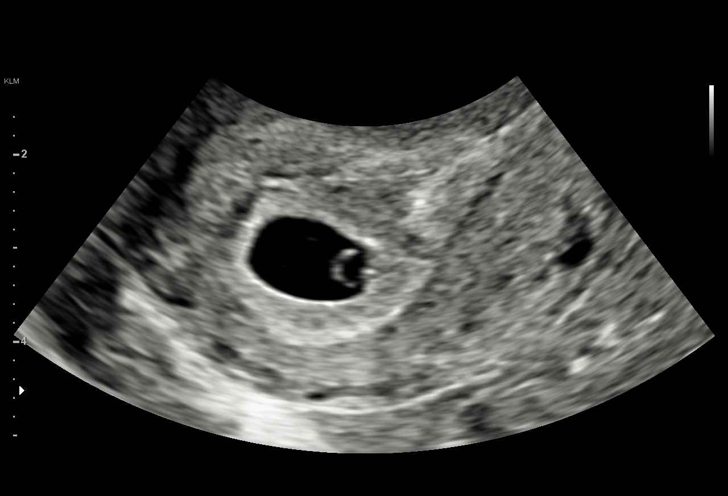
[im 31/50]
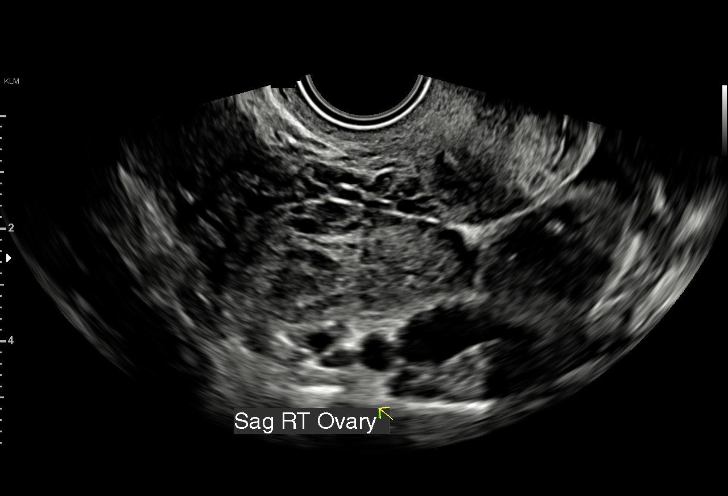
[im 35/50]
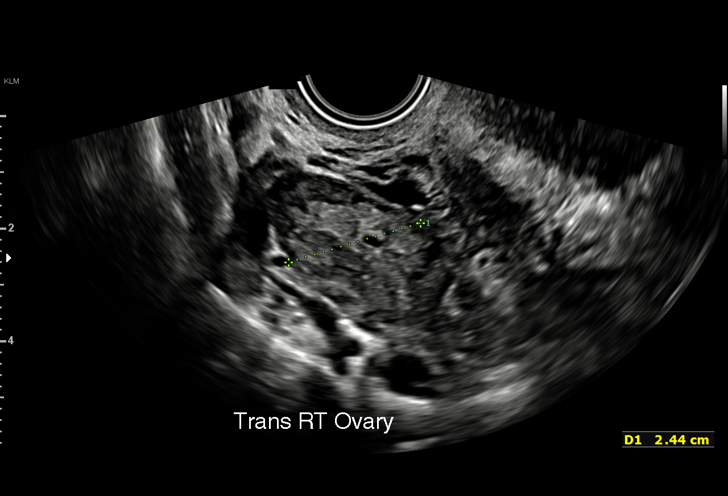
[im 39/50]
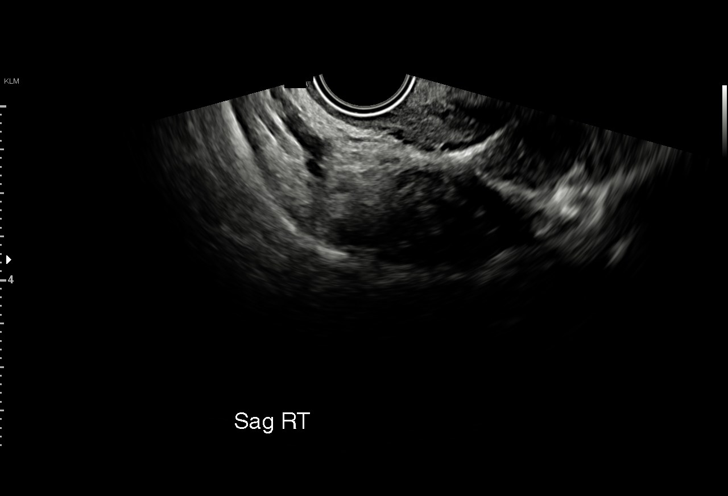
[im 42/50]
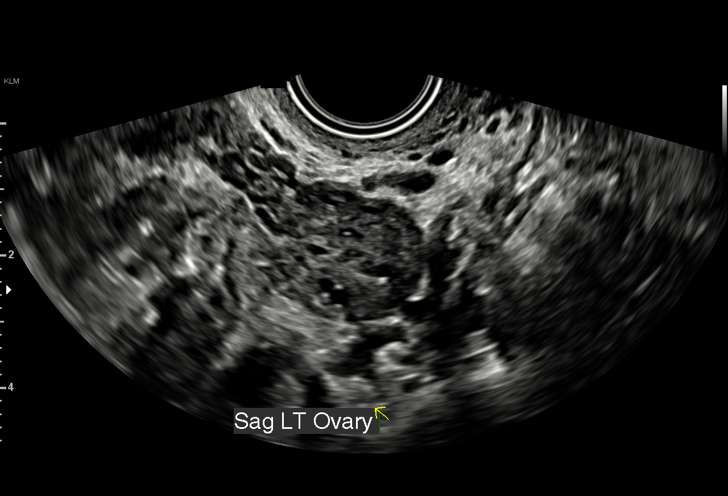
[im 46/50]
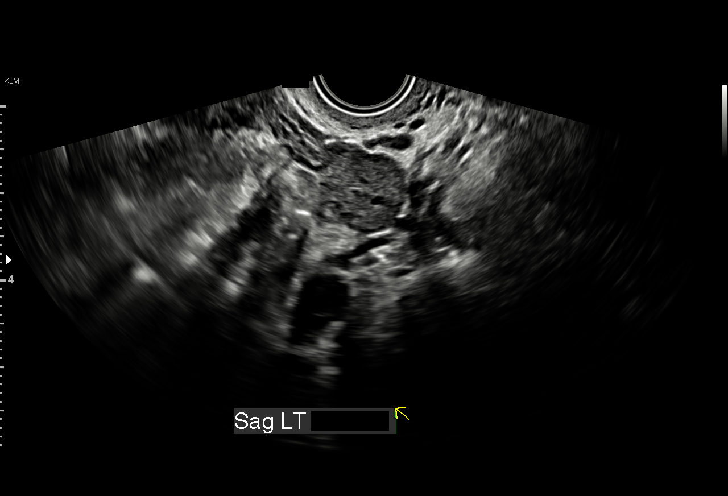
[im 50/50]
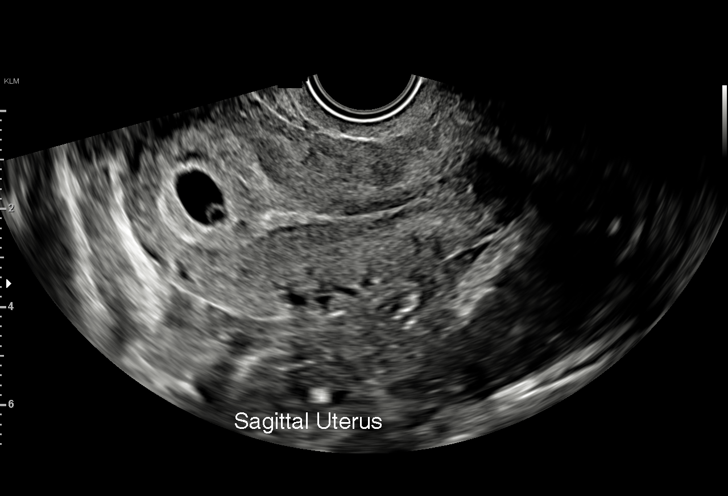

[15 of 28 positions shown; findings below may reference images not displayed]

FINDINGS: Intrauterine gestational sac: Single

Yolk sac:  Visualized.

Embryo:  Not Visualized.

MSD: 12 mm   5 w   6 d

Subchorionic hemorrhage:  None visualized.

Maternal uterus/adnexae: Both ovaries are normal in appearance. No
mass or abnormal free fluid identified.
IMPRESSION: Single intrauterine gestational sac measuring 5 weeks 6 days by mean
sac diameter. Suggest correlation with serial b-hCG levels, and
consider followup ultrasound to assess viability in 10 days.

## 2021-05-06 IMAGING — US US OB TRANSVAGINAL
1 series · 15 of 28 positions shown · non-contrast
Comparison: 06/06/2020

CLINICAL DATA: Viability

EXAM:
TRANSVAGINAL OB ULTRASOUND
TECHNIQUE: Transvaginal ultrasound was performed for complete evaluation of the
gestation as well as the maternal uterus, adnexal regions, and
pelvic cul-de-sac.

[Series 1: us ob transvaginal · 50 acquisitions, 15 frames shown]
[im 1/50]
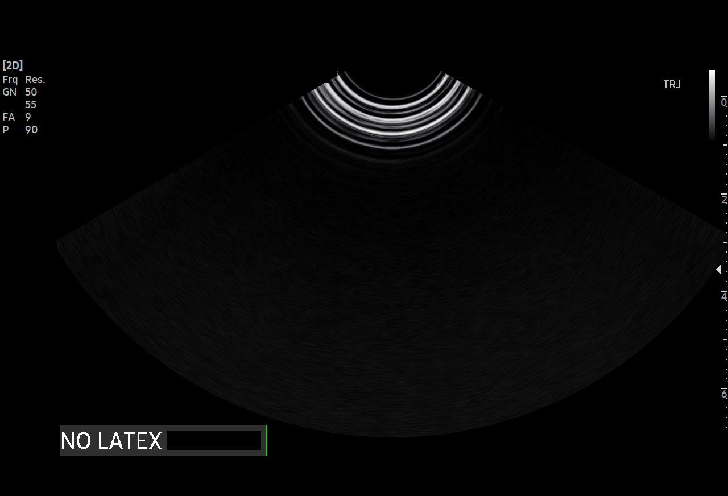
[im 4/50]
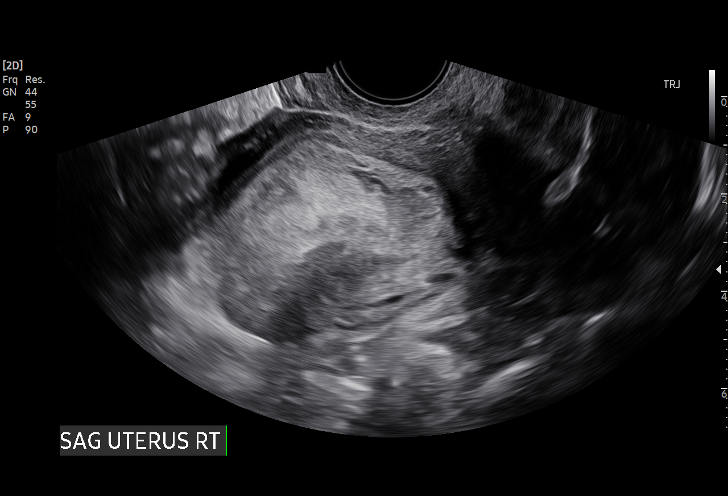
[im 8/50]
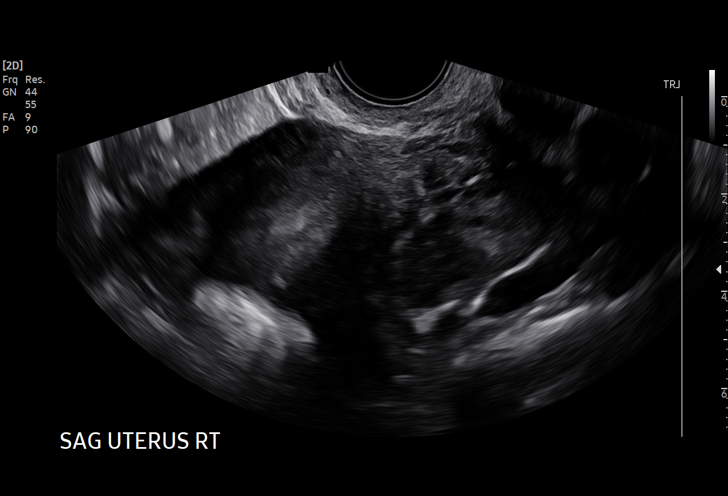
[im 11/50]
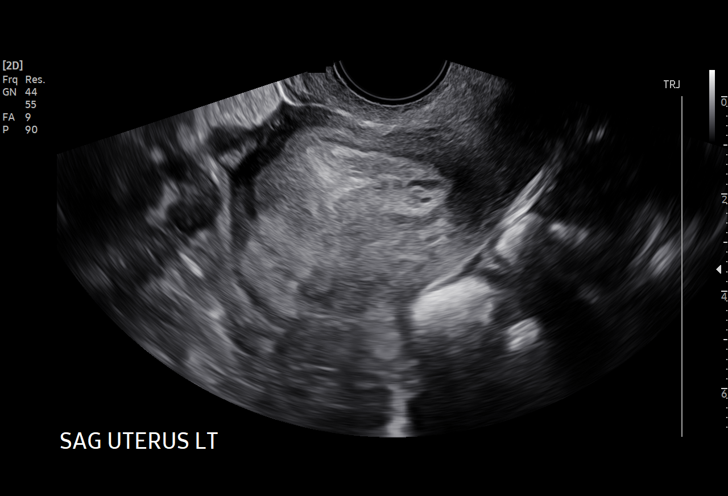
[im 15/50]
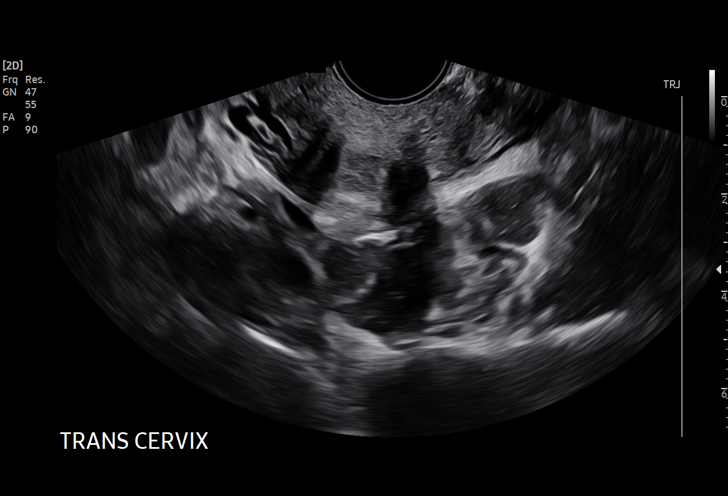
[im 19/50]
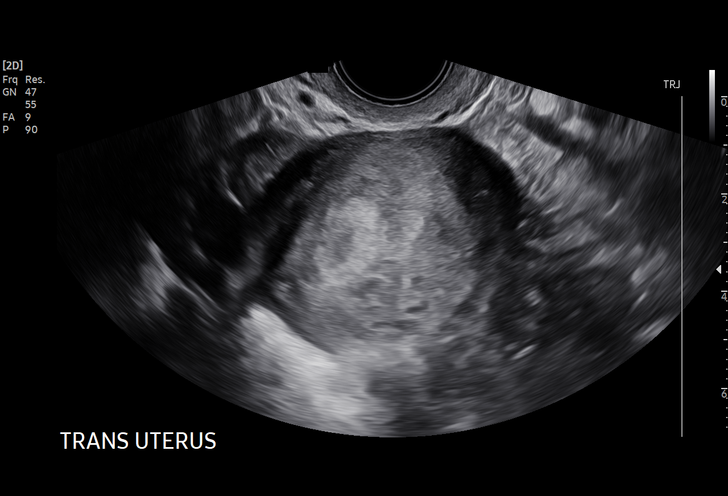
[im 22/50]
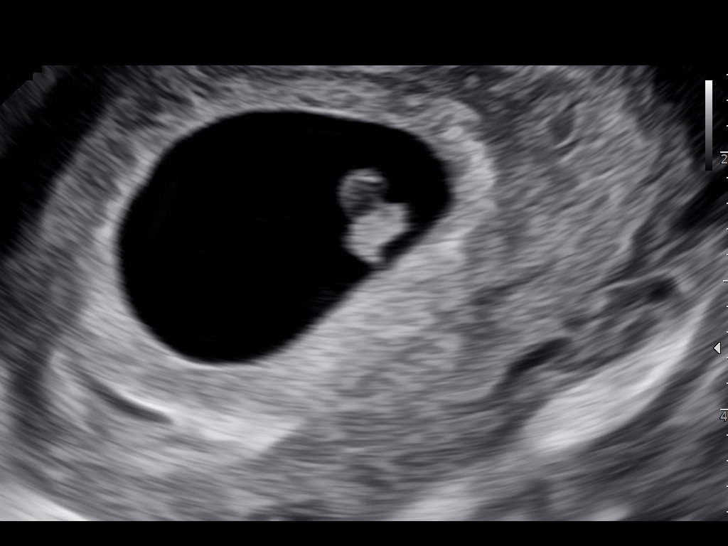
[im 26/50]
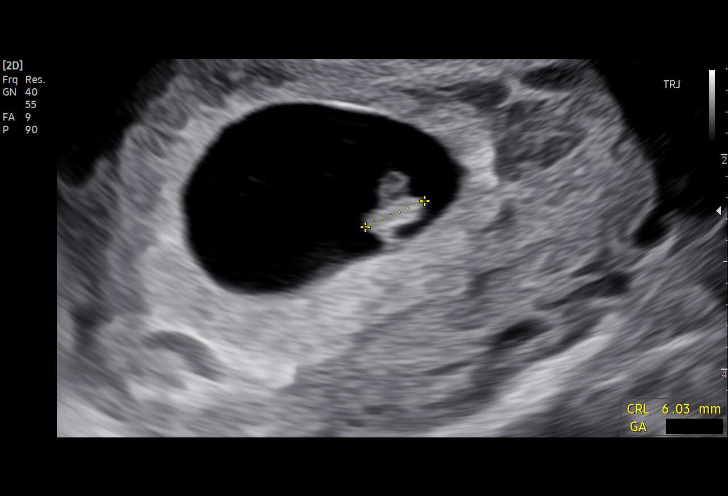
[im 28/50]
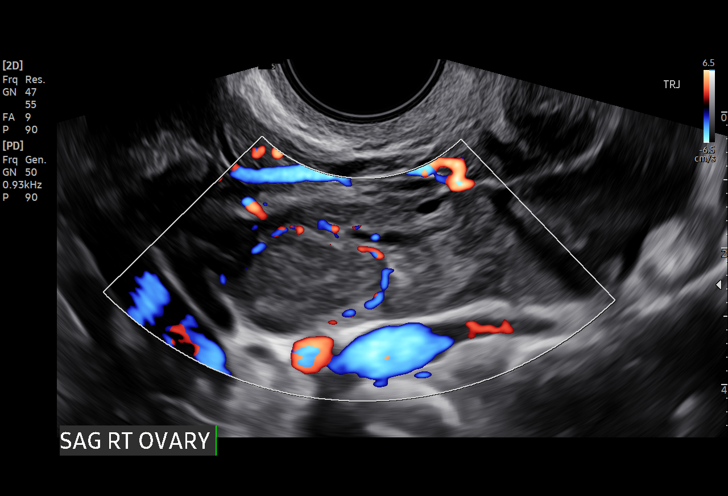
[im 31/50]
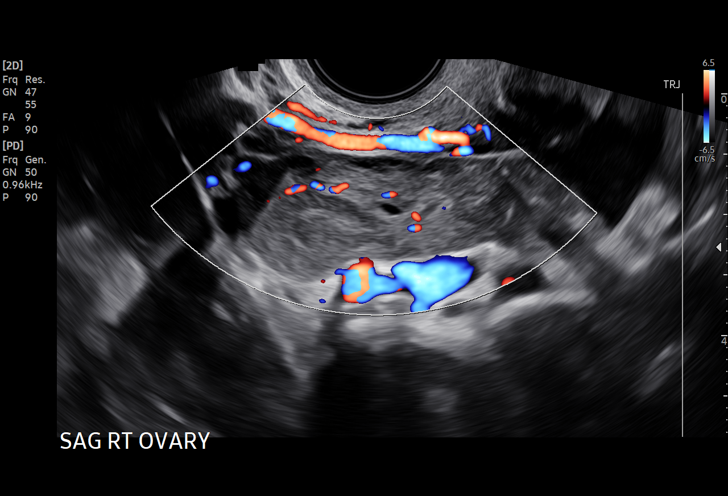
[im 35/50]
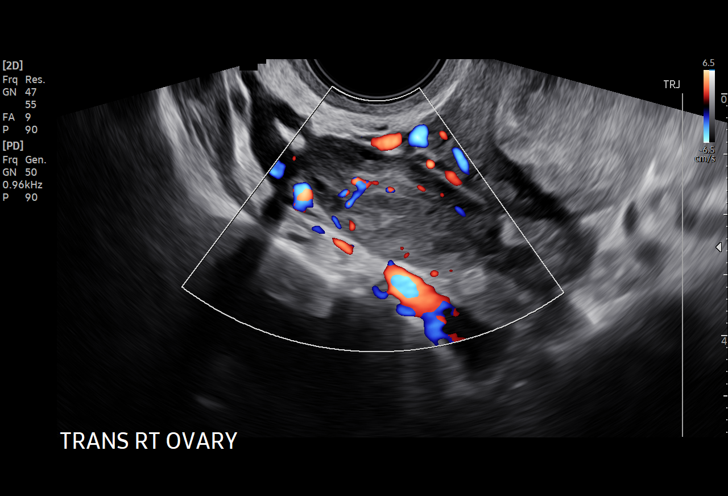
[im 39/50]
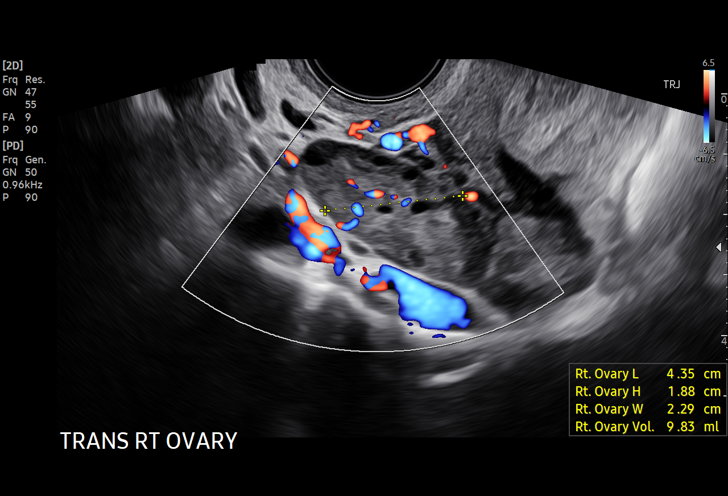
[im 42/50]
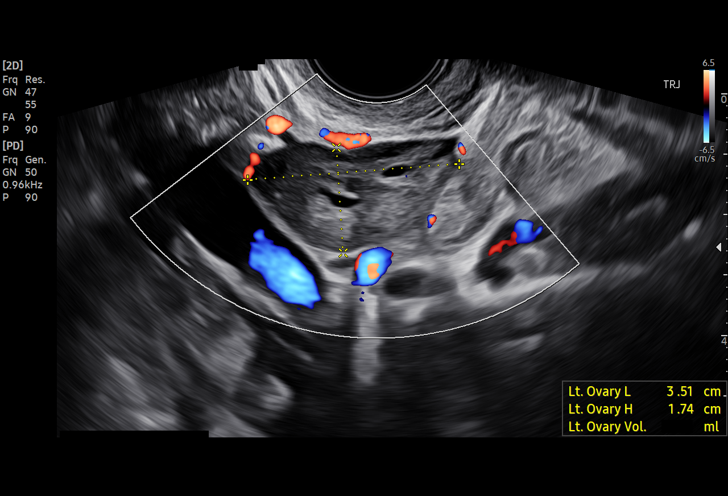
[im 46/50]
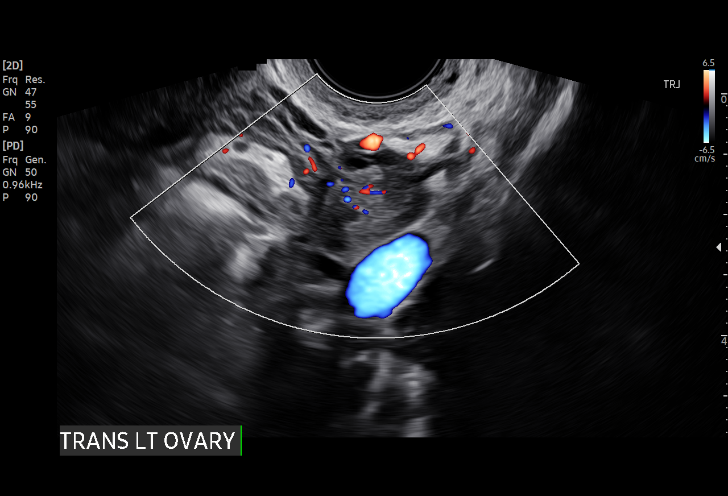
[im 50/50]
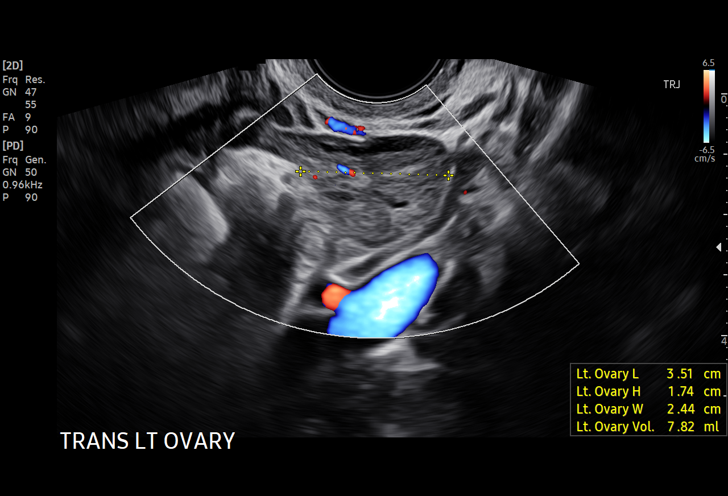

[15 of 28 positions shown; findings below may reference images not displayed]

FINDINGS: Intrauterine gestational sac: Single

Yolk sac:  Visualized

Embryo:  Visualized

Cardiac Activity: Visualized

Heart Rate: 119 bpm

CRL: 6 mm   6 w 3 d                  US EDC: 02/01/2021

Subchorionic hemorrhage:  None visualized.

Maternal uterus/adnexae: Right ovary measures 4.4 x 1.9 by 2.3 cm
and contains corpus luteum. Left ovary measures 3.5 x 1.7 by 2.4 cm.
Trace free fluid
IMPRESSION: 1. Single viable intrauterine pregnancy with visualized embryo and
estimated sonographic age of 6 weeks 3 days and ultrasound EDC of
02/01/2021.
2. Trace free fluid.  Otherwise no specific abnormality is seen

## 2021-12-03 ENCOUNTER — Ambulatory Visit (INDEPENDENT_AMBULATORY_CARE_PROVIDER_SITE_OTHER): Payer: 59 | Admitting: Obstetrics

## 2021-12-03 ENCOUNTER — Other Ambulatory Visit (HOSPITAL_COMMUNITY)
Admission: RE | Admit: 2021-12-03 | Discharge: 2021-12-03 | Disposition: A | Discharge status: Home / Self Care | Payer: 59 | Source: Ambulatory Visit | Attending: Obstetrics | Admitting: Obstetrics

## 2021-12-03 ENCOUNTER — Encounter: Payer: Self-pay | Admitting: Obstetrics

## 2021-12-03 VITALS — BP 130/79 | HR 93 | Wt 144.0 lb

## 2021-12-03 DIAGNOSIS — Z113 Encounter for screening for infections with a predominantly sexual mode of transmission: Secondary | ICD-10-CM

## 2021-12-03 DIAGNOSIS — Z01419 Encounter for gynecological examination (general) (routine) without abnormal findings: Secondary | ICD-10-CM | POA: Insufficient documentation

## 2021-12-03 DIAGNOSIS — N898 Other specified noninflammatory disorders of vagina: Secondary | ICD-10-CM | POA: Diagnosis not present

## 2021-12-03 NOTE — Progress Notes (Signed)
Subjective:        Kelly Henry is a 26 y.o. female here for a routine exam.  Current complaints: Vaginal discharge.    Personal health questionnaire:  Is patient Kelly Henry, have a family history of breast and/or ovarian cancer: no Is there a family history of uterine cancer diagnosed at age < 72, gastrointestinal cancer, urinary tract cancer, family member who is a Personnel officer syndrome-associated carrier: no Is the patient overweight and hypertensive, family history of diabetes, personal history of gestational diabetes, preeclampsia or PCOS: no Is patient over 21, have PCOS,  family history of premature CHD under age 30, diabetes, smoke, have hypertension or peripheral artery disease:  no At any time, has a partner hit, kicked or otherwise hurt or frightened you?: no Over the past 2 weeks, have you felt down, depressed or hopeless?: no Over the past 2 weeks, have you felt little interest or pleasure in doing things?:no   Gynecologic History Patient's last menstrual period was 11/28/2021. Contraception: none Last Pap: 2022  . Results were: normal Last mammogram: n/a. Results were: n/a  Obstetric History OB History  Gravida Para Term Preterm AB Living  1 1 1  0 0 1  SAB IAB Ectopic Multiple Live Births  0 0 0 0 1    # Outcome Date GA Lbr Len/2nd Weight Sex Delivery Anes PTL Lv  1 Term 02/04/21 [redacted]w[redacted]d  7 lb 12.5 oz (3.53 kg) M CS-LTranv EPI  LIV    Past Medical History:  Diagnosis Date   Asthma    HSV-1 (herpes simplex virus 1) infection     Past Surgical History:  Procedure Laterality Date   CESAREAN SECTION  02/04/2021   Procedure: CESAREAN SECTION;  Surgeon: 02/06/2021, MD;  Location: [facility] LD ORS;  Service: Obstetrics;;   NO PAST SURGERIES       Current Outpatient Medications:    Prenatal Vit-Fe Fumarate-FA (PREPLUS) 27-1 MG TABS, Take 1 tablet by mouth daily., Disp: 30 tablet, Rfl: 13 No Known Allergies  Social History   Tobacco Use   Smoking status:  Former    Types: Cigarettes    Quit date: 03/01/2014    Years since quitting: 7.7    Passive exposure: Past   Smokeless tobacco: Never   Tobacco comments:    Mother smokes  Substance Use Topics   Alcohol use: Not Currently    Alcohol/week: 3.0 standard drinks of alcohol    Types: 3 Standard drinks or equivalent per week    History reviewed. No pertinent family history.    Review of Systems  Constitutional: negative for fatigue and weight loss Respiratory: negative for cough and wheezing Cardiovascular: negative for chest pain, fatigue and palpitations Gastrointestinal: negative for abdominal pain and change in bowel habits Musculoskeletal:negative for myalgias Neurological: negative for gait problems and tremors Behavioral/Psych: negative for abusive relationship, depression Endocrine: negative for temperature intolerance    Genitourinary:positive for vaginal discharge.  negative for abnormal menstrual periods, genital lesions, hot flashes, sexual problems  Integument/breast: negative for breast lump, breast tenderness, nipple discharge and skin lesion(s)    Objective:       BP 130/79   Pulse 93   Wt 144 lb (65.3 kg)   LMP 11/28/2021   Breastfeeding Yes   BMI 26.34 kg/m  General:   Alert and no distress  Skin:   no rash or abnormalities  Lungs:   clear to auscultation bilaterally  Heart:   regular rate and rhythm, S1, S2 normal, no  murmur, click, rub or gallop  Breasts:   normal without suspicious masses, skin or nipple changes or axillary nodes  Abdomen:  normal findings: no organomegaly, soft, non-tender and no hernia  Pelvis:  External genitalia: normal general appearance Urinary system: urethral meatus normal and bladder without fullness, nontender Vaginal: normal without tenderness, induration or masses Cervix: normal appearance Adnexa: normal bimanual exam Uterus: anteverted and non-tender, normal size   Lab Review Urine pregnancy test Labs reviewed  yes Radiologic studies reviewed no  I have spent a total of 20 minutes of face-to-face time, excluding clinical staff time, reviewing notes and preparing to see patient, ordering tests and/or medications, and counseling the patient.   Assessment:    1. Encounter for routine gynecological examination with Papanicolaou smear of cervix Rx: - Cytology - PAP( [facility])  2. Vaginal discharge Rx: - Cervicovaginal ancillary only( [facility])  3. Screening examination for sexually transmitted disease Rx: - Hepatitis B surface antigen - Hepatitis C antibody - RPR - HIV Antibody (routine testing w rflx)     Plan:    Education reviewed: calcium supplements, depression evaluation, low fat, low cholesterol diet, safe sex/STD prevention, self breast exams, and weight bearing exercise. Contraception: condoms. Follow up in: 1 year.    Orders Placed This Encounter  Procedures   Hepatitis B surface antigen   Hepatitis C antibody   RPR   HIV Antibody (routine testing w rflx)     Shelly Bombard, MD 12/03/2021 2:30 PM

## 2021-12-03 NOTE — Progress Notes (Signed)
Pt presents for annual and all std testing.

## 2021-12-04 LAB — CERVICOVAGINAL ANCILLARY ONLY
Bacterial Vaginitis (gardnerella): POSITIVE — AB
Candida Glabrata: NEGATIVE
Candida Vaginitis: NEGATIVE
Chlamydia: NEGATIVE
Comment: NEGATIVE
Comment: NEGATIVE
Comment: NEGATIVE
Comment: NEGATIVE
Comment: NEGATIVE
Comment: NORMAL
Neisseria Gonorrhea: NEGATIVE
Trichomonas: NEGATIVE

## 2021-12-05 LAB — HEPATITIS C ANTIBODY: Hep C Virus Ab: NONREACTIVE

## 2021-12-05 LAB — HIV ANTIBODY (ROUTINE TESTING W REFLEX): HIV Screen 4th Generation wRfx: NONREACTIVE

## 2021-12-05 LAB — HEPATITIS B SURFACE ANTIGEN: Hepatitis B Surface Ag: NEGATIVE

## 2021-12-05 LAB — RPR: RPR Ser Ql: NONREACTIVE

## 2021-12-08 ENCOUNTER — Other Ambulatory Visit: Payer: Self-pay | Admitting: Obstetrics

## 2021-12-08 DIAGNOSIS — N760 Acute vaginitis: Secondary | ICD-10-CM

## 2021-12-08 LAB — CYTOLOGY - PAP: Diagnosis: NEGATIVE

## 2021-12-08 MED ORDER — METRONIDAZOLE 500 MG PO TABS: 500.0000 mg | ORAL_TABLET | Freq: Two times a day (BID) | ORAL | 2 refills | Status: DC

## 2021-12-08 NOTE — Progress Notes (Signed)
TC to notify pt of BV and RX Flagyl. Pt verbalized understanding. Pharmacy location verified with pt.

## 2022-03-09 ENCOUNTER — Other Ambulatory Visit (HOSPITAL_COMMUNITY)
Admission: RE | Admit: 2022-03-09 | Discharge: 2022-03-09 | Disposition: A | Discharge status: Home / Self Care | Payer: 59 | Source: Ambulatory Visit | Attending: Obstetrics and Gynecology | Admitting: Obstetrics and Gynecology

## 2022-03-09 ENCOUNTER — Ambulatory Visit (INDEPENDENT_AMBULATORY_CARE_PROVIDER_SITE_OTHER): Payer: 59 | Admitting: Emergency Medicine

## 2022-03-09 VITALS — BP 112/72 | HR 80 | Ht 62.0 in | Wt 150.2 lb

## 2022-03-09 DIAGNOSIS — Z113 Encounter for screening for infections with a predominantly sexual mode of transmission: Secondary | ICD-10-CM

## 2022-03-09 NOTE — Progress Notes (Signed)
SUBJECTIVE:  27 y.o. female desires STD check up.  Denies abnormal vaginal bleeding or significant pelvic pain or fever. No UTI symptoms. Denies history of known exposure to STD.  No LMP recorded.  OBJECTIVE:  She appears well, afebrile.   ASSESSMENT:  Vaginal Discharge  Vaginal Odor   PLAN:  GC, chlamydia, trichomonas, BVAG, CVAG probe sent to lab. Treatment: To be determined once lab results are received ROV prn if symptoms persist or worsen.

## 2022-03-10 LAB — CERVICOVAGINAL ANCILLARY ONLY
Bacterial Vaginitis (gardnerella): NEGATIVE
Candida Glabrata: NEGATIVE
Candida Vaginitis: POSITIVE — AB
Chlamydia: NEGATIVE
Comment: NEGATIVE
Comment: NEGATIVE
Comment: NEGATIVE
Comment: NEGATIVE
Comment: NEGATIVE
Comment: NORMAL
Neisseria Gonorrhea: NEGATIVE
Trichomonas: NEGATIVE

## 2022-03-10 LAB — HEPATITIS B SURFACE ANTIGEN: Hepatitis B Surface Ag: NEGATIVE

## 2022-03-10 LAB — RPR: RPR Ser Ql: NONREACTIVE

## 2022-03-10 LAB — HEPATITIS C ANTIBODY: Hep C Virus Ab: NONREACTIVE

## 2022-03-10 LAB — HIV ANTIBODY (ROUTINE TESTING W REFLEX): HIV Screen 4th Generation wRfx: NONREACTIVE

## 2022-03-11 ENCOUNTER — Other Ambulatory Visit: Payer: Self-pay

## 2022-03-11 ENCOUNTER — Telehealth: Payer: Self-pay

## 2022-03-11 DIAGNOSIS — B379 Candidiasis, unspecified: Secondary | ICD-10-CM

## 2022-03-11 MED ORDER — FLUCONAZOLE 150 MG PO TABS: 150.0000 mg | ORAL_TABLET | Freq: Once | ORAL | 0 refills | Status: AC

## 2022-03-11 NOTE — Telephone Encounter (Signed)
Prescription sent to the pharmacy for Diflucan

## 2022-03-22 ENCOUNTER — Other Ambulatory Visit: Payer: Self-pay | Admitting: *Deleted

## 2022-03-22 DIAGNOSIS — Z30011 Encounter for initial prescription of contraceptive pills: Secondary | ICD-10-CM

## 2022-03-22 MED ORDER — SLYND 4 MG PO TABS: 4.0000 mg | ORAL_TABLET | Freq: Every day | ORAL | 7 refills | Status: DC

## 2022-03-22 NOTE — Progress Notes (Signed)
TC from pt requesting SLYND RX. Seen by Dr. Jodi Mourning 12/03/21 for annual. Dr. Mikel Cella was consulted. RX approved. RX sent to pharmacy. Pt was made aware that RX may not be authorized by insurance and may require a prior auth. Pt was also made aware of manufacturer coupon. Pt will call pharmacy to see the cost with the coupon. If she decides it is too much, then she will let us know to try sending RX to mail order pharmacy.

## 2022-03-22 NOTE — Progress Notes (Signed)
Message from pt requesting RX Slynd be sent to [facility] mail order pharmacy to try to seek prior authorization assistance.

## 2022-06-11 ENCOUNTER — Other Ambulatory Visit: Payer: Self-pay | Admitting: Obstetrics and Gynecology

## 2022-06-11 DIAGNOSIS — Z30011 Encounter for initial prescription of contraceptive pills: Secondary | ICD-10-CM

## 2022-08-27 ENCOUNTER — Ambulatory Visit (INDEPENDENT_AMBULATORY_CARE_PROVIDER_SITE_OTHER): Payer: 59

## 2022-08-27 ENCOUNTER — Other Ambulatory Visit (HOSPITAL_COMMUNITY)
Admission: RE | Admit: 2022-08-27 | Discharge: 2022-08-27 | Disposition: A | Discharge status: Home / Self Care | Payer: No Typology Code available for payment source | Source: Ambulatory Visit | Attending: Obstetrics and Gynecology | Admitting: Obstetrics and Gynecology

## 2022-08-27 ENCOUNTER — Other Ambulatory Visit: Payer: Self-pay | Admitting: Obstetrics and Gynecology

## 2022-08-27 DIAGNOSIS — N898 Other specified noninflammatory disorders of vagina: Secondary | ICD-10-CM

## 2022-08-27 NOTE — Progress Notes (Signed)
..  SUBJECTIVE:  27 y.o. female reports new partner and need for STD testing. Denies abnormal vaginal bleeding or significant pelvic pain or fever. No UTI symptoms. Unknown exposure to STD.  No LMP recorded.  OBJECTIVE:  She appears well, afebrile. Urine dipstick: not done.  ASSESSMENT:  New partner Unknown exposure to STD  PLAN:  GC, chlamydia, trichomonas, BVAG, CVAG probe sent to lab. Treatment: To be determined once lab results are received ROV prn if symptoms persist or worsen.

## 2022-08-28 LAB — HEPATITIS C ANTIBODY: Hep C Virus Ab: NONREACTIVE

## 2022-08-28 LAB — HEPATITIS B SURFACE ANTIGEN: Hepatitis B Surface Ag: NEGATIVE

## 2022-08-28 LAB — HIV ANTIBODY (ROUTINE TESTING W REFLEX): HIV Screen 4th Generation wRfx: NONREACTIVE

## 2022-08-28 LAB — RPR: RPR Ser Ql: NONREACTIVE

## 2022-08-30 LAB — CERVICOVAGINAL ANCILLARY ONLY
Bacterial Vaginitis (gardnerella): POSITIVE — AB
Candida Glabrata: NEGATIVE
Candida Vaginitis: POSITIVE — AB
Chlamydia: POSITIVE — AB
Comment: NEGATIVE
Comment: NEGATIVE
Comment: NEGATIVE
Comment: NEGATIVE
Comment: NEGATIVE
Comment: NORMAL
Neisseria Gonorrhea: NEGATIVE
Trichomonas: NEGATIVE

## 2022-08-31 ENCOUNTER — Other Ambulatory Visit: Payer: Self-pay

## 2022-08-31 ENCOUNTER — Telehealth: Payer: Self-pay

## 2022-08-31 DIAGNOSIS — B379 Candidiasis, unspecified: Secondary | ICD-10-CM

## 2022-08-31 DIAGNOSIS — A749 Chlamydial infection, unspecified: Secondary | ICD-10-CM

## 2022-08-31 DIAGNOSIS — B9689 Other specified bacterial agents as the cause of diseases classified elsewhere: Secondary | ICD-10-CM

## 2022-08-31 MED ORDER — FLUCONAZOLE 150 MG PO TABS: 150.0000 mg | ORAL_TABLET | Freq: Once | ORAL | 1 refills | Status: AC

## 2022-08-31 MED ORDER — DOXYCYCLINE HYCLATE 100 MG PO CAPS: 100.0000 mg | ORAL_CAPSULE | Freq: Two times a day (BID) | ORAL | 0 refills | Status: AC

## 2022-08-31 MED ORDER — METRONIDAZOLE 0.75 % VA GEL: 1.0000 | Freq: Every day | VAGINAL | 0 refills | Status: AC

## 2022-08-31 NOTE — Telephone Encounter (Signed)
-----   Message from Hermina Staggers sent at 08/30/2022  4:20 PM EDT ----- Please let pt know vaginal swab results and send in Rx for Tx as per protocol Please advise to have partner tested as well Nurse visit in 4 weeks for [facility] Thanks Casimiro Needle

## 2022-08-31 NOTE — Telephone Encounter (Signed)
Pt called regarding lab results. Pt informed of results and Rx sent per protocol. Pt advised to abstain from intercourse until all individuals have been tested and treated. TOC scheduled. Pt voices understanding and has no further questions at this time.

## 2022-09-21 ENCOUNTER — Other Ambulatory Visit (HOSPITAL_COMMUNITY)
Admission: RE | Admit: 2022-09-21 | Discharge: 2022-09-21 | Disposition: A | Discharge status: Home / Self Care | Payer: No Typology Code available for payment source | Source: Ambulatory Visit | Attending: Obstetrics and Gynecology | Admitting: Obstetrics and Gynecology

## 2022-09-21 ENCOUNTER — Ambulatory Visit: Payer: No Typology Code available for payment source

## 2022-09-21 VITALS — BP 122/68 | HR 74 | Ht 61.0 in | Wt 150.0 lb

## 2022-09-21 DIAGNOSIS — Z113 Encounter for screening for infections with a predominantly sexual mode of transmission: Secondary | ICD-10-CM | POA: Insufficient documentation

## 2022-09-21 DIAGNOSIS — A749 Chlamydial infection, unspecified: Secondary | ICD-10-CM | POA: Insufficient documentation

## 2022-09-21 DIAGNOSIS — N898 Other specified noninflammatory disorders of vagina: Secondary | ICD-10-CM | POA: Diagnosis present

## 2022-09-21 NOTE — Progress Notes (Signed)
SUBJECTIVE:  27 y.o. female who desires a test of cure. Denies abnormal vaginal discharge, bleeding or significant pelvic pain. No UTI symptoms. History of known exposure to Chlamydia.  No LMP recorded (lmp unknown).  OBJECTIVE:  She appears well.   ASSESSMENT:  STI Screen   PLAN:  GC, chlamydia,trichomonas, CVAG, and BVAG probe sent to lab.  Treatment: To be determined once lab results are received.  Pt follow up as needed.Marland Kitchen

## 2022-10-18 ENCOUNTER — Other Ambulatory Visit: Payer: Self-pay | Admitting: Obstetrics and Gynecology

## 2022-10-18 ENCOUNTER — Other Ambulatory Visit: Payer: Self-pay

## 2022-10-18 DIAGNOSIS — B9689 Other specified bacterial agents as the cause of diseases classified elsewhere: Secondary | ICD-10-CM

## 2022-10-18 MED ORDER — METRONIDAZOLE 0.75 % VA GEL: 1.0000 | Freq: Every day | VAGINAL | 1 refills | Status: DC

## 2023-02-16 ENCOUNTER — Other Ambulatory Visit (HOSPITAL_COMMUNITY)
Admission: RE | Admit: 2023-02-16 | Discharge: 2023-02-16 | Disposition: A | Discharge status: Home / Self Care | Payer: 59 | Source: Ambulatory Visit | Attending: Obstetrics and Gynecology | Admitting: Obstetrics and Gynecology

## 2023-02-16 ENCOUNTER — Ambulatory Visit: Payer: No Typology Code available for payment source | Admitting: *Deleted

## 2023-02-16 VITALS — BP 125/83 | HR 92

## 2023-02-16 DIAGNOSIS — Z113 Encounter for screening for infections with a predominantly sexual mode of transmission: Secondary | ICD-10-CM | POA: Insufficient documentation

## 2023-02-16 NOTE — Progress Notes (Signed)
SUBJECTIVE:  27 y.o. female who desires a STI screen. Denies abnormal vaginal discharge, bleeding or significant pelvic pain. No UTI symptoms. Denies history of known exposure to STD.  No LMP recorded.  OBJECTIVE:  She appears well.   ASSESSMENT:  STI Screen   PLAN:  Pt offered STI blood screening-requested GC, chlamydia, and trichomonas probe sent to lab.  Treatment: To be determined once lab results are received.  Pt follow up as needed.  

## 2023-02-17 LAB — CERVICOVAGINAL ANCILLARY ONLY
Bacterial Vaginitis (gardnerella): POSITIVE — AB
Candida Glabrata: NEGATIVE
Candida Vaginitis: NEGATIVE
Chlamydia: NEGATIVE
Comment: NEGATIVE
Comment: NEGATIVE
Comment: NEGATIVE
Comment: NEGATIVE
Comment: NEGATIVE
Comment: NORMAL
Neisseria Gonorrhea: NEGATIVE
Trichomonas: NEGATIVE

## 2023-02-17 LAB — HIV ANTIBODY (ROUTINE TESTING W REFLEX): HIV Screen 4th Generation wRfx: NONREACTIVE

## 2023-02-17 LAB — HEPATITIS C ANTIBODY: Hep C Virus Ab: NONREACTIVE

## 2023-02-17 LAB — HEPATITIS B SURFACE ANTIGEN: Hepatitis B Surface Ag: NEGATIVE

## 2023-02-17 LAB — RPR: RPR Ser Ql: NONREACTIVE

## 2023-02-18 ENCOUNTER — Other Ambulatory Visit: Payer: Self-pay | Admitting: Obstetrics

## 2023-02-18 ENCOUNTER — Other Ambulatory Visit: Payer: Self-pay | Admitting: *Deleted

## 2023-02-18 DIAGNOSIS — Z3041 Encounter for surveillance of contraceptive pills: Secondary | ICD-10-CM

## 2023-02-18 MED ORDER — NORETHINDRONE 0.35 MG PO TABS: 1.0000 | ORAL_TABLET | Freq: Every day | ORAL | 0 refills | Status: DC

## 2023-02-21 ENCOUNTER — Other Ambulatory Visit: Payer: Self-pay | Admitting: *Deleted

## 2023-02-21 DIAGNOSIS — B9689 Other specified bacterial agents as the cause of diseases classified elsewhere: Secondary | ICD-10-CM

## 2023-02-21 MED ORDER — METRONIDAZOLE 0.75 % VA GEL: Freq: Every day | VAGINAL | 1 refills | Status: DC

## 2023-02-21 NOTE — Progress Notes (Signed)
Metrogel sent for +BV See lab result 

## 2023-03-30 ENCOUNTER — Ambulatory Visit: Payer: No Typology Code available for payment source | Admitting: Obstetrics and Gynecology

## 2023-05-10 ENCOUNTER — Encounter (HOSPITAL_COMMUNITY): Payer: Self-pay

## 2023-05-10 ENCOUNTER — Ambulatory Visit (HOSPITAL_COMMUNITY)
Admission: EM | Admit: 2023-05-10 | Discharge: 2023-05-10 | Disposition: A | Discharge status: Home / Self Care | Attending: Physician Assistant | Admitting: Physician Assistant

## 2023-05-10 DIAGNOSIS — J029 Acute pharyngitis, unspecified: Secondary | ICD-10-CM | POA: Insufficient documentation

## 2023-05-10 DIAGNOSIS — J069 Acute upper respiratory infection, unspecified: Secondary | ICD-10-CM | POA: Diagnosis not present

## 2023-05-10 LAB — POCT RAPID STREP A (OFFICE): Rapid Strep A Screen: NEGATIVE

## 2023-05-10 LAB — POCT INFLUENZA A/B
Influenza A, POC: NEGATIVE
Influenza B, POC: NEGATIVE

## 2023-05-10 NOTE — ED Triage Notes (Signed)
 Pt c/o sore throat and runny nose since yesterday. States hurts to swallow.

## 2023-05-10 NOTE — ED Provider Notes (Signed)
 [facility]    CSN: [id number] Arrival date & time: 05/10/23  1518      History   Chief Complaint Chief Complaint  Patient presents with   Sore Throat    HPI Kelly Henry is a 28 y.o. female.   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 Authorizing Provider  norethindrone (MICRONOR) 0.35 MG tablet Take 1 tablet (0.35 mg total) by mouth daily. 02/18/23   Brock Bad, MD  norethindrone (MICRONOR) 0.35 MG tablet Take 1 tablet (0.35 mg total) by mouth daily. 02/18/23   Brock Bad, MD  Prenatal Vit-Fe Fumarate-FA (PREPLUS) 27-1 MG TABS Take 1 tablet by mouth daily. Patient not taking: Reported on 03/09/2022 11/28/20   Bernerd Limbo, CNM  famotidine (PEPCID) 20 MG tablet Take 1 tablet (20 mg total) by mouth 2 (two) times daily. Patient not taking: Reported on 05/31/2015 09/14/14 03/11/19  Charlestine Night, PA-C  QVAR 40 MCG/ACT inhaler inhale 1 puff by mouth twice a day for asthma Patient not taking: Reported on 09/14/2014 01/15/12 03/11/19  Macy Mis, MD  sucralfate (CARAFATE) 1 G tablet Take 1 tablet (1 g total) by mouth 4 (four) times daily. Patient not taking: Reported on 05/31/2015 09/14/14 03/11/19  Lawyer, Cristal Deer, PA-C  VENTOLIN HFA 108 (90 BASE) MCG/ACT inhaler INHALE 2 PUFFS INTO THE LUNGS EVERY 4 HOURS AS NEEDED, RESCUE INHALER: FOR SHORTNESS OF BREATH, USE IN PLACE OF ALBUTEROL NEBS Patient not taking: Reported on 05/31/2015 10/04/13 03/11/19  Doreene Eland, MD    Family History History reviewed. No pertinent family history.  Social History Social History   Tobacco Use   Smoking status: Former    Current packs/day: 0.00    Types: Cigarettes    Quit date: 03/01/2014    Years since quitting: 9.1    Passive exposure: Past   Smokeless tobacco: Never   Tobacco comments:    Mother smokes  Vaping Use   Vaping status: Never Used  Substance Use Topics   Alcohol use: Not Currently  Alcohol/week: 3.0 standard drinks of alcohol    Types: 3 Standard drinks or equivalent per week   Drug use: Not Currently    Types: Marijuana     Allergies   Patient has no known allergies.   Review of Systems Review of Systems  Constitutional:  Negative for chills and fever.  HENT:  Positive for congestion (over the weekend), rhinorrhea, sore throat and voice change. Negative for ear pain, sinus pressure and sinus pain.   Respiratory:  Positive for cough (midl and intermitent). Negative for shortness of breath.   Gastrointestinal:  Negative for diarrhea, nausea and vomiting.  Musculoskeletal:  Positive for myalgias.  Neurological:  Positive for headaches.     Physical Exam Triage Vital Signs ED Triage Vitals [05/10/23 1536]  Encounter Vitals Group     BP 138/80     Systolic BP Percentile      Diastolic BP Percentile      Pulse Rate (!) 114     Resp 18     Temp  98.4 F (36.9 C)     Temp Source Oral     SpO2 97 %     Weight      Height      Head Circumference      Peak Flow      Pain Score 7     Pain Loc      Pain Education      Exclude from Growth Chart    No data found.  Updated Vital Signs BP 138/80 (BP Location: Left Arm)   Pulse (!) 114   Temp 98.4 F (36.9 C) (Oral)   Resp 18   LMP 04/20/2023 (Approximate)   SpO2 97%   Visual Acuity Right Eye Distance:   Left Eye Distance:   Bilateral Distance:    Right Eye Near:   Left Eye Near:    Bilateral Near:     Physical Exam Vitals reviewed.  Constitutional:      General: She is awake.     Appearance: Normal appearance. She is well-developed and well-groomed.  HENT:     Head: Normocephalic and atraumatic.     Right Ear: Hearing, tympanic membrane and ear canal normal.     Left Ear: Hearing, tympanic membrane and ear canal normal.     Mouth/Throat:     Lips: Pink.     Mouth: Mucous membranes are moist.     Pharynx: Oropharynx is clear. Uvula midline. No pharyngeal swelling, oropharyngeal exudate, posterior oropharyngeal erythema, uvula swelling or postnasal drip.  Cardiovascular:     Rate and Rhythm: Normal rate and regular rhythm.     Pulses: Normal pulses.          Radial pulses are 2+ on the right side and 2+ on the left side.     Heart sounds: Normal heart sounds. No murmur heard.    No friction rub. No gallop.  Pulmonary:     Effort: Pulmonary effort is normal.     Breath sounds: Normal breath sounds. No decreased air movement. No decreased breath sounds, wheezing, rhonchi or rales.  Musculoskeletal:     Cervical back: Normal range of motion and neck supple.  Lymphadenopathy:     Head:     Right side of head: No submental, submandibular or preauricular adenopathy.     Left side of head: No submental, submandibular or preauricular adenopathy.     Cervical:     Right cervical: No superficial cervical adenopathy.    Left cervical:  No superficial cervical  adenopathy.     Upper Body:     Right upper body: No supraclavicular adenopathy.     Left upper body: No supraclavicular adenopathy.  Skin:    General: Skin is warm and dry.  Neurological:     General: No focal deficit present.     Mental Status: She is alert and oriented to person, place, and time.  Psychiatric:        Mood and Affect: Mood normal.        Behavior: Behavior normal. Behavior is cooperative.      UC Treatments / Results  Labs (all labs ordered are listed, but only abnormal results are displayed) Labs Reviewed  CULTURE, GROUP A STREP [facility])  POCT RAPID STREP A (OFFICE)  POCT INFLUENZA A/B    EKG   Radiology No results found.  Procedures Procedures (including critical care time)  Medications Ordered in UC Medications - No data to display  Initial Impression / Assessment and Plan / UC Course  I have reviewed the triage vital signs and the nursing notes.  Pertinent labs & imaging results that were available during my care of the patient were reviewed by me and considered in my medical decision making (see chart for details).      Final Clinical Impressions(s) / UC Diagnoses   Final diagnoses:  Sore throat  Viral upper respiratory tract infection  Patient presents today with concerns for sore throat, fatigue and voice change.  She reports that she is having some pain with swallowing.  She reports that over the weekend she did have some nasal congestion and runny nose but she took a medication and did a nasal flush and is significantly improved.  Physical exam is overall reassuring as are vitals.  Rapid strep and flu test were both negative.  Will get strep culture for definitive rule out.  At this time recommend conservative measures with over-the-counter medications for symptomatic relief.  ED return precautions reviewed and provided in after visit summary.  Follow-up as needed for progressing or persistent symptoms    Discharge Instructions       You were seen today for concerns for sore throat, fatigue and voice changes At this time your strep and flu testing were negative. It is not very clear whether your symptoms are due to an infectious process (like a cough or cold) vs allergies but management is largely similar and focuses on helping you feel better. At this time I recommend that you continue taking your antihistamine every day.  This will help with potential nasal congestion and postnasal drip.  I also recommend adding Flonase nasal spray At least once per day to assist with runny nose and nasal congestion. You can alternate Tylenol and ibuprofen as needed for sore throat and overall body aches. You can also try Chloraseptic throat spray which can provide a numbing sensation and a sore throat.  Warm tea with honey is also very effective in helping sore throat and voice changes. If you feel like your symptoms are getting worse or not responding to the regimen suggested above over the next 5 to 7 days you can return here or go to your primary care provider for further evaluation and management. If you feel like your symptoms are getting severely worse, you develop fevers that are not responding to medications, difficulty breathing, passing out or lethargy please go to the emergency room as these could be signs of a medical emergency.      ED  Prescriptions   None    PDMP not reviewed this encounter.   Roselind Messier 05/10/23 2031

## 2023-05-10 NOTE — Discharge Instructions (Addendum)
 You were seen today for concerns for sore throat, fatigue and voice changes At this time your strep and flu testing were negative. It is not very clear whether your symptoms are due to an infectious process (like a cough or cold) vs allergies but management is largely similar and focuses on helping you feel better. At this time I recommend that you continue taking your antihistamine every day.  This will help with potential nasal congestion and postnasal drip.  I also recommend adding Flonase nasal spray At least once per day to assist with runny nose and nasal congestion. You can alternate Tylenol and ibuprofen as needed for sore throat and overall body aches. You can also try Chloraseptic throat spray which can provide a numbing sensation and a sore throat.  Warm tea with honey is also very effective in helping sore throat and voice changes. If you feel like your symptoms are getting worse or not responding to the regimen suggested above over the next 5 to 7 days you can return here or go to your primary care provider for further evaluation and management. If you feel like your symptoms are getting severely worse, you develop fevers that are not responding to medications, difficulty breathing, passing out or lethargy please go to the emergency room as these could be signs of a medical emergency.

## 2023-05-11 ENCOUNTER — Telehealth (HOSPITAL_COMMUNITY): Payer: Self-pay

## 2023-05-11 LAB — CULTURE, GROUP A STREP (THRC)

## 2023-05-11 MED ORDER — AMOXICILLIN 500 MG PO CAPS: 500.0000 mg | ORAL_CAPSULE | Freq: Two times a day (BID) | ORAL | 0 refills | Status: AC

## 2023-05-11 NOTE — Telephone Encounter (Signed)
 Per protocol, pt requires treatment with Amoxicillin.  Reviewed with patient, verified pharmacy, prescription sent.

## 2023-06-01 ENCOUNTER — Ambulatory Visit: Payer: No Typology Code available for payment source | Admitting: Physician Assistant

## 2023-06-01 ENCOUNTER — Encounter: Payer: Self-pay | Admitting: Physician Assistant

## 2023-06-01 ENCOUNTER — Other Ambulatory Visit (HOSPITAL_COMMUNITY)
Admission: RE | Admit: 2023-06-01 | Discharge: 2023-06-01 | Disposition: A | Discharge status: Home / Self Care | Source: Ambulatory Visit | Attending: Obstetrics and Gynecology | Admitting: Obstetrics and Gynecology

## 2023-06-01 VITALS — BP 127/83 | HR 90 | Ht 62.0 in | Wt 166.8 lb

## 2023-06-01 DIAGNOSIS — Z01419 Encounter for gynecological examination (general) (routine) without abnormal findings: Secondary | ICD-10-CM

## 2023-06-01 DIAGNOSIS — Z1339 Encounter for screening examination for other mental health and behavioral disorders: Secondary | ICD-10-CM | POA: Diagnosis not present

## 2023-06-01 DIAGNOSIS — Z113 Encounter for screening for infections with a predominantly sexual mode of transmission: Secondary | ICD-10-CM

## 2023-06-01 DIAGNOSIS — Z3041 Encounter for surveillance of contraceptive pills: Secondary | ICD-10-CM

## 2023-06-01 DIAGNOSIS — Z124 Encounter for screening for malignant neoplasm of cervix: Secondary | ICD-10-CM

## 2023-06-01 MED ORDER — NORETHINDRONE 0.35 MG PO TABS: 1.0000 | ORAL_TABLET | Freq: Every day | ORAL | 3 refills | Status: AC

## 2023-06-01 NOTE — Progress Notes (Signed)
 ANNUAL EXAM Patient name: Kelly Henry MRN [id number]  Date of birth: 1995/10/20 Chief Complaint:   No chief complaint on file.  History of Present Illness:   Kelly Henry is a 28 y.o. G54P1001 female being seen today for a routine annual exam.   Current complaints: persistent BV  Patient's last menstrual period was 05/27/2023 (exact date).   The pregnancy intention screening data noted above was reviewed. Potential methods of contraception were discussed. The patient elected to proceed with No data recorded.   Last pap 2023- NILM, HR HPV negative. H/O abnormal pap: no due 2026 Last mammogram: never done due to age.  Family h/o breast cancer: no Last colonoscopy: never done due to age. Family h/o colorectal cancer: no     06/01/2023   10:35 AM 11/11/2020    8:57 AM 07/24/2020    9:31 AM 05/21/2014   10:11 AM 04/24/2014    9:39 AM  Depression screen PHQ 2/9  Decreased Interest 0 1 1 0 0  Down, Depressed, Hopeless 0 0 0 0 0  PHQ - 2 Score 0 1 1 0 0  Altered sleeping 0 2 2    Tired, decreased energy 0 1 2    Change in appetite 0 0 0    Feeling bad or failure about yourself  0 0 0    Trouble concentrating 0 0 0    Moving slowly or fidgety/restless 0 0 0    Suicidal thoughts 0 0 0    PHQ-9 Score 0 4 5          06/01/2023   10:35 AM 11/11/2020    8:57 AM 07/24/2020    9:31 AM  GAD 7 : Generalized Anxiety Score  Nervous, Anxious, on Edge 0 0 0  Control/stop worrying 0 0 0  Worry too much - different things 0 0 0  Trouble relaxing 0 0 0  Restless 0 0 1  Easily annoyed or irritable 0 0 1  Afraid - awful might happen 0 0 0  Total GAD 7 Score 0 0 2     Review of Systems:   Pertinent items are noted in HPI Denies any headaches, blurred vision, fatigue, shortness of breath, chest pain, abdominal pain, abnormal vaginal discharge/itching/odor/irritation, problems with periods, bowel movements, urination, or intercourse unless otherwise stated above. Pertinent History  Reviewed:  Reviewed past medical,surgical, social and family history.  Reviewed problem list, medications and allergies. Physical Assessment:   Vitals:   06/01/23 1021  BP: 127/83  Pulse: 90  Weight: 166 lb 12.8 oz (75.7 kg)  Height: 5\' 2"  (1.575 m)  Body mass index is 30.51 kg/m.        Physical Examination:   General appearance - well appearing, and in no distress  Mental status - alert, oriented to person, place, and time  Psych:  She has a normal mood and affect  Skin - warm and dry, normal color, no suspicious lesions noted  Chest - effort normal, all lung fields clear to auscultation bilaterally  Heart - normal rate and regular rhythm, normal S1 and S2 sounds without murmurs, rubs or gallops.   Neck:  midline trachea, no thyromegaly or nodules  Breasts - breasts appear normal, no suspicious masses, no skin or nipple changes or axillary node enlargement   Abdomen - soft, nontender, nondistended, no masses or organomegaly  Extremities:  No swelling or varicosities noted  Chaperone present for exam  No results found for this or any previous visit (  from the past 24 hours).  Assessment & Plan:  1. Encounter for annual routine gynecological examination (Primary) 2. Cervical cancer screening  - Cervical cancer screening: Discussed screening Q3 years. Reviewed importance of annual exams and limits of pap smear. Pap with reflex HPV due 2026.  - GC/CT: Discussed and recommended. Pt  accepts - Birth Control: POPs- refilled  - Breast Health: Encouraged self breast awareness/exams. Teaching provided. - Mammogram: @ 30yo, or sooner if problems - Colonoscopy: @ 30yo, or sooner if problems - Follow-up: 12 months and prn   3. Routine screening for STI (sexually transmitted infection) - Cervicovaginal ancillary only( [place]) - RPR - HIV antibody (with reflex) - Hepatitis C Antibody - Hepatitis B Surface AntiGEN  4. Encounter for surveillance of contraceptive pills -  norethindrone  (MICRONOR ) 0.35 MG tablet; Take 1 tablet (0.35 mg total) by mouth daily.  Dispense: 84 tablet; Refill: 3  Orders Placed This Encounter  Procedures   RPR   HIV antibody (with reflex)   Hepatitis C Antibody   Hepatitis B Surface AntiGEN    Meds:  Meds ordered this encounter  Medications   norethindrone  (MICRONOR ) 0.35 MG tablet    Sig: Take 1 tablet (0.35 mg total) by mouth daily.    Dispense:  84 tablet    Refill:  3   Follow-up: Return in about 1 year (around 05/31/2024) for ANN.  Rodrigues Urbanek E Onesha Krebbs, PA-C 06/01/2023 11:43 AM

## 2023-06-01 NOTE — Progress Notes (Signed)
 Pt presents for annual. Pt does not need a pap, wants all std testing.

## 2023-06-02 ENCOUNTER — Encounter: Payer: Self-pay | Admitting: Physician Assistant

## 2023-06-02 LAB — RPR: RPR Ser Ql: NONREACTIVE

## 2023-06-02 LAB — CERVICOVAGINAL ANCILLARY ONLY
Bacterial Vaginitis (gardnerella): POSITIVE — AB
Candida Glabrata: NEGATIVE
Candida Vaginitis: NEGATIVE
Chlamydia: NEGATIVE
Comment: NEGATIVE
Comment: NEGATIVE
Comment: NEGATIVE
Comment: NEGATIVE
Comment: NEGATIVE
Comment: NORMAL
Neisseria Gonorrhea: NEGATIVE
Trichomonas: NEGATIVE

## 2023-06-02 LAB — HEPATITIS B SURFACE ANTIGEN: Hepatitis B Surface Ag: NEGATIVE

## 2023-06-02 LAB — HEPATITIS C ANTIBODY: Hep C Virus Ab: NONREACTIVE

## 2023-06-02 LAB — HIV ANTIBODY (ROUTINE TESTING W REFLEX): HIV Screen 4th Generation wRfx: NONREACTIVE

## 2023-06-03 ENCOUNTER — Encounter: Payer: Self-pay | Admitting: Physician Assistant

## 2024-02-22 ENCOUNTER — Ambulatory Visit (INDEPENDENT_AMBULATORY_CARE_PROVIDER_SITE_OTHER)

## 2024-02-22 ENCOUNTER — Other Ambulatory Visit (HOSPITAL_COMMUNITY)
Admission: RE | Admit: 2024-02-22 | Discharge: 2024-02-22 | Disposition: A | Discharge status: Home / Self Care | Source: Ambulatory Visit | Attending: Obstetrics & Gynecology | Admitting: Obstetrics & Gynecology

## 2024-02-22 VITALS — BP 130/80 | HR 73 | Ht 62.0 in | Wt 179.6 lb

## 2024-02-22 DIAGNOSIS — Z113 Encounter for screening for infections with a predominantly sexual mode of transmission: Secondary | ICD-10-CM

## 2024-02-22 NOTE — Progress Notes (Signed)
 SUBJECTIVE:  29 y.o. female who desires a STI screen. Denies abnormal vaginal discharge, bleeding or significant pelvic pain. No UTI symptoms. Denies history of known exposure to STD. Notes sexually active and last testing a year ago.  02/22/24  OBJECTIVE:  She appears well.   ASSESSMENT:  STI Screen   PLAN:  Pt offered STI blood screening-requested and ordered GC, chlamydia, and trichomonas probe sent to lab.  Treatment: To be determined once lab results are received.  Pt follow up as needed. Advised Annual due in April/May

## 2024-02-23 LAB — HEPATITIS B SURFACE ANTIGEN: Hepatitis B Surface Ag: NEGATIVE

## 2024-02-23 LAB — HIV ANTIBODY (ROUTINE TESTING W REFLEX): HIV Screen 4th Generation wRfx: NONREACTIVE

## 2024-02-23 LAB — CERVICOVAGINAL ANCILLARY ONLY
Bacterial Vaginitis (gardnerella): POSITIVE — AB
Candida Glabrata: NEGATIVE
Candida Vaginitis: NEGATIVE
Chlamydia: NEGATIVE
Comment: NEGATIVE
Comment: NEGATIVE
Comment: NEGATIVE
Comment: NEGATIVE
Comment: NEGATIVE
Comment: NORMAL
Neisseria Gonorrhea: NEGATIVE
Trichomonas: NEGATIVE

## 2024-02-23 LAB — HEPATITIS C ANTIBODY: Hep C Virus Ab: NONREACTIVE

## 2024-02-23 LAB — SYPHILIS: RPR W/REFLEX TO RPR TITER AND TREPONEMAL ANTIBODIES, TRADITIONAL SCREENING AND DIAGNOSIS ALGORITHM: RPR Ser Ql: NONREACTIVE

## 2024-02-24 ENCOUNTER — Other Ambulatory Visit: Payer: Self-pay | Admitting: Obstetrics and Gynecology

## 2024-02-24 ENCOUNTER — Ambulatory Visit: Payer: Self-pay | Admitting: Obstetrics & Gynecology

## 2024-02-24 ENCOUNTER — Other Ambulatory Visit: Payer: Self-pay | Admitting: Obstetrics & Gynecology

## 2024-02-24 DIAGNOSIS — B9689 Other specified bacterial agents as the cause of diseases classified elsewhere: Secondary | ICD-10-CM

## 2024-02-24 MED ORDER — METRONIDAZOLE 500 MG PO TABS: 500.0000 mg | ORAL_TABLET | Freq: Two times a day (BID) | ORAL | 0 refills | Status: AC
